# Patient Record
Sex: Female | Born: 1980 | Race: Black or African American | Hispanic: No | Marital: Single | Smoking: Smoker, current status unknown
Health system: Southern US, Community
[De-identification: ages and names within clinical notes are randomized; demographics above are authoritative.]

## PROBLEM LIST (undated history)

## (undated) DIAGNOSIS — R011 Cardiac murmur, unspecified: Secondary | ICD-10-CM

## (undated) DIAGNOSIS — Z9889 Other specified postprocedural states: Secondary | ICD-10-CM

## (undated) DIAGNOSIS — R51 Headache: Secondary | ICD-10-CM

## (undated) DIAGNOSIS — G8929 Other chronic pain: Secondary | ICD-10-CM

## (undated) DIAGNOSIS — R0789 Other chest pain: Secondary | ICD-10-CM

## (undated) DIAGNOSIS — R569 Unspecified convulsions: Secondary | ICD-10-CM

## (undated) DIAGNOSIS — J449 Chronic obstructive pulmonary disease, unspecified: Secondary | ICD-10-CM

## (undated) DIAGNOSIS — M7989 Other specified soft tissue disorders: Secondary | ICD-10-CM

## (undated) DIAGNOSIS — R0602 Shortness of breath: Secondary | ICD-10-CM

## (undated) DIAGNOSIS — K219 Gastro-esophageal reflux disease without esophagitis: Secondary | ICD-10-CM

## (undated) DIAGNOSIS — M79605 Pain in left leg: Secondary | ICD-10-CM

## (undated) DIAGNOSIS — M79604 Pain in right leg: Secondary | ICD-10-CM

## (undated) DIAGNOSIS — I38 Endocarditis, valve unspecified: Secondary | ICD-10-CM

## (undated) DIAGNOSIS — I10 Essential (primary) hypertension: Secondary | ICD-10-CM

## (undated) DIAGNOSIS — R112 Nausea with vomiting, unspecified: Secondary | ICD-10-CM

## (undated) DIAGNOSIS — F329 Major depressive disorder, single episode, unspecified: Secondary | ICD-10-CM

## (undated) DIAGNOSIS — I351 Nonrheumatic aortic (valve) insufficiency: Secondary | ICD-10-CM

## (undated) DIAGNOSIS — G2581 Restless legs syndrome: Secondary | ICD-10-CM

## (undated) DIAGNOSIS — F32A Depression, unspecified: Secondary | ICD-10-CM

## (undated) DIAGNOSIS — Z87898 Personal history of other specified conditions: Secondary | ICD-10-CM

## (undated) HISTORY — PX: CYSTOSCOPY/RETROGRADE/URETEROSCOPY/STONE EXTRACTION WITH BASKET: SHX5317

## (undated) HISTORY — DX: Nonrheumatic aortic (valve) insufficiency: I35.1

## (undated) HISTORY — DX: Other specified soft tissue disorders: M79.89

## (undated) HISTORY — DX: Pain in left leg: M79.605

## (undated) HISTORY — DX: Other chronic pain: G89.29

## (undated) HISTORY — PX: OTHER SURGICAL HISTORY: SHX169

## (undated) HISTORY — DX: Other chest pain: R07.89

## (undated) HISTORY — PX: TUBAL LIGATION: SHX77

## (undated) HISTORY — DX: Personal history of other specified conditions: Z87.898

## (undated) HISTORY — DX: Pain in right leg: M79.604

---
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
## 1999-04-29 ENCOUNTER — Ambulatory Visit (HOSPITAL_COMMUNITY): Admission: RE | Admit: 1999-04-29 | Discharge: 1999-04-29 | Payer: Self-pay | Admitting: *Deleted

## 1999-06-23 ENCOUNTER — Other Ambulatory Visit: Admission: RE | Admit: 1999-06-23 | Discharge: 1999-06-23 | Payer: Self-pay | Admitting: *Deleted

## 1999-08-13 ENCOUNTER — Inpatient Hospital Stay (HOSPITAL_COMMUNITY): Admission: RE | Admit: 1999-08-13 | Discharge: 1999-08-13 | Payer: Self-pay | Admitting: Obstetrics & Gynecology

## 1999-08-18 ENCOUNTER — Encounter (HOSPITAL_COMMUNITY): Admission: RE | Admit: 1999-08-18 | Discharge: 1999-09-10 | Payer: Self-pay | Admitting: Obstetrics

## 1999-09-07 ENCOUNTER — Inpatient Hospital Stay (HOSPITAL_COMMUNITY): Admission: AD | Admit: 1999-09-07 | Discharge: 1999-09-07 | Payer: Self-pay | Admitting: Obstetrics

## 1999-09-09 ENCOUNTER — Encounter (INDEPENDENT_AMBULATORY_CARE_PROVIDER_SITE_OTHER): Payer: Self-pay

## 1999-09-09 ENCOUNTER — Inpatient Hospital Stay (HOSPITAL_COMMUNITY): Admission: AD | Admit: 1999-09-09 | Discharge: 1999-09-12 | Payer: Self-pay | Admitting: *Deleted

## 1999-12-26 ENCOUNTER — Other Ambulatory Visit: Admission: RE | Admit: 1999-12-26 | Discharge: 1999-12-26 | Payer: Self-pay | Admitting: Obstetrics

## 1999-12-26 ENCOUNTER — Encounter (INDEPENDENT_AMBULATORY_CARE_PROVIDER_SITE_OTHER): Payer: Self-pay

## 2000-10-29 ENCOUNTER — Emergency Department (HOSPITAL_COMMUNITY): Admission: EM | Admit: 2000-10-29 | Discharge: 2000-10-29 | Payer: Self-pay | Admitting: *Deleted

## 2001-03-14 ENCOUNTER — Encounter (INDEPENDENT_AMBULATORY_CARE_PROVIDER_SITE_OTHER): Payer: Self-pay | Admitting: Specialist

## 2001-03-14 ENCOUNTER — Ambulatory Visit (HOSPITAL_BASED_OUTPATIENT_CLINIC_OR_DEPARTMENT_OTHER): Admission: RE | Admit: 2001-03-14 | Discharge: 2001-03-15 | Payer: Self-pay | Admitting: Orthopedic Surgery

## 2001-04-04 ENCOUNTER — Encounter: Payer: Self-pay | Admitting: Thoracic Surgery

## 2001-04-04 ENCOUNTER — Inpatient Hospital Stay (HOSPITAL_COMMUNITY): Admission: RE | Admit: 2001-04-04 | Discharge: 2001-04-06 | Payer: Self-pay | Admitting: Thoracic Surgery

## 2001-04-04 ENCOUNTER — Encounter (INDEPENDENT_AMBULATORY_CARE_PROVIDER_SITE_OTHER): Payer: Self-pay | Admitting: Specialist

## 2001-04-05 ENCOUNTER — Encounter: Payer: Self-pay | Admitting: Thoracic Surgery

## 2001-04-06 ENCOUNTER — Encounter: Payer: Self-pay | Admitting: Thoracic Surgery

## 2002-08-03 ENCOUNTER — Encounter (INDEPENDENT_AMBULATORY_CARE_PROVIDER_SITE_OTHER): Payer: Self-pay | Admitting: Specialist

## 2002-08-03 ENCOUNTER — Inpatient Hospital Stay (HOSPITAL_COMMUNITY): Admission: EM | Admit: 2002-08-03 | Discharge: 2002-08-04 | Payer: Self-pay | Admitting: Emergency Medicine

## 2002-08-03 ENCOUNTER — Encounter: Payer: Self-pay | Admitting: Emergency Medicine

## 2003-03-09 ENCOUNTER — Emergency Department (HOSPITAL_COMMUNITY): Admission: EM | Admit: 2003-03-09 | Discharge: 2003-03-09 | Payer: Self-pay | Admitting: Emergency Medicine

## 2003-05-24 ENCOUNTER — Ambulatory Visit (HOSPITAL_COMMUNITY): Admission: RE | Admit: 2003-05-24 | Discharge: 2003-05-24 | Payer: Self-pay | Admitting: *Deleted

## 2003-06-04 ENCOUNTER — Other Ambulatory Visit: Admission: RE | Admit: 2003-06-04 | Discharge: 2003-06-04 | Payer: Self-pay | Admitting: Obstetrics and Gynecology

## 2003-07-30 ENCOUNTER — Encounter: Payer: Self-pay | Admitting: Obstetrics and Gynecology

## 2003-07-30 ENCOUNTER — Inpatient Hospital Stay (HOSPITAL_COMMUNITY): Admission: AD | Admit: 2003-07-30 | Discharge: 2003-07-31 | Payer: Self-pay | Admitting: *Deleted

## 2003-07-30 ENCOUNTER — Encounter (INDEPENDENT_AMBULATORY_CARE_PROVIDER_SITE_OTHER): Payer: Self-pay | Admitting: Specialist

## 2003-11-06 ENCOUNTER — Encounter: Admission: RE | Admit: 2003-11-06 | Discharge: 2003-11-06 | Payer: Self-pay | Admitting: *Deleted

## 2003-11-20 ENCOUNTER — Encounter: Admission: RE | Admit: 2003-11-20 | Discharge: 2003-11-20 | Payer: Self-pay | Admitting: General Practice

## 2005-04-24 ENCOUNTER — Emergency Department (HOSPITAL_COMMUNITY): Admission: EM | Admit: 2005-04-24 | Discharge: 2005-04-24 | Payer: Self-pay | Admitting: Emergency Medicine

## 2006-11-09 HISTORY — PX: TUBAL LIGATION: SHX77

## 2007-02-08 ENCOUNTER — Emergency Department (HOSPITAL_COMMUNITY): Admission: EM | Admit: 2007-02-08 | Discharge: 2007-02-08 | Payer: Self-pay | Admitting: Emergency Medicine

## 2007-03-02 ENCOUNTER — Ambulatory Visit (HOSPITAL_COMMUNITY): Admission: RE | Admit: 2007-03-02 | Discharge: 2007-03-02 | Payer: Self-pay | Admitting: Obstetrics & Gynecology

## 2007-03-24 ENCOUNTER — Ambulatory Visit: Payer: Self-pay | Admitting: Obstetrics & Gynecology

## 2007-04-08 ENCOUNTER — Ambulatory Visit (HOSPITAL_COMMUNITY): Admission: RE | Admit: 2007-04-08 | Discharge: 2007-04-08 | Payer: Self-pay | Admitting: Obstetrics & Gynecology

## 2007-04-21 ENCOUNTER — Ambulatory Visit: Payer: Self-pay | Admitting: Obstetrics & Gynecology

## 2007-05-16 ENCOUNTER — Ambulatory Visit (HOSPITAL_COMMUNITY): Admission: RE | Admit: 2007-05-16 | Discharge: 2007-05-16 | Payer: Self-pay | Admitting: Obstetrics & Gynecology

## 2007-05-19 ENCOUNTER — Ambulatory Visit: Payer: Self-pay | Admitting: Family Medicine

## 2007-06-09 ENCOUNTER — Ambulatory Visit: Payer: Self-pay | Admitting: Family Medicine

## 2007-06-23 ENCOUNTER — Ambulatory Visit: Payer: Self-pay | Admitting: Obstetrics & Gynecology

## 2007-06-23 ENCOUNTER — Ambulatory Visit (HOSPITAL_COMMUNITY): Admission: RE | Admit: 2007-06-23 | Discharge: 2007-06-23 | Payer: Self-pay | Admitting: Family Medicine

## 2007-07-07 ENCOUNTER — Ambulatory Visit: Payer: Self-pay | Admitting: Family Medicine

## 2007-07-14 ENCOUNTER — Ambulatory Visit (HOSPITAL_COMMUNITY): Admission: RE | Admit: 2007-07-14 | Discharge: 2007-07-14 | Payer: Self-pay | Admitting: Family Medicine

## 2007-07-19 ENCOUNTER — Ambulatory Visit: Payer: Self-pay | Admitting: Family Medicine

## 2007-07-19 ENCOUNTER — Inpatient Hospital Stay (HOSPITAL_COMMUNITY): Admission: AD | Admit: 2007-07-19 | Discharge: 2007-07-25 | Payer: Self-pay | Admitting: Family Medicine

## 2007-07-20 ENCOUNTER — Encounter: Payer: Self-pay | Admitting: *Deleted

## 2007-07-21 ENCOUNTER — Encounter: Payer: Self-pay | Admitting: *Deleted

## 2007-07-22 ENCOUNTER — Encounter: Payer: Self-pay | Admitting: *Deleted

## 2007-07-22 ENCOUNTER — Encounter: Payer: Self-pay | Admitting: Family Medicine

## 2007-08-01 ENCOUNTER — Ambulatory Visit: Payer: Self-pay | Admitting: Obstetrics & Gynecology

## 2007-08-01 ENCOUNTER — Inpatient Hospital Stay (HOSPITAL_COMMUNITY): Admission: AD | Admit: 2007-08-01 | Discharge: 2007-08-03 | Payer: Self-pay | Admitting: Obstetrics & Gynecology

## 2007-08-05 ENCOUNTER — Ambulatory Visit: Payer: Self-pay | Admitting: *Deleted

## 2007-08-08 ENCOUNTER — Ambulatory Visit: Payer: Self-pay | Admitting: Obstetrics & Gynecology

## 2007-08-18 ENCOUNTER — Ambulatory Visit: Payer: Self-pay | Admitting: Obstetrics & Gynecology

## 2007-09-01 ENCOUNTER — Ambulatory Visit: Payer: Self-pay | Admitting: Gynecology

## 2007-11-16 ENCOUNTER — Ambulatory Visit (HOSPITAL_BASED_OUTPATIENT_CLINIC_OR_DEPARTMENT_OTHER): Admission: RE | Admit: 2007-11-16 | Discharge: 2007-11-16 | Payer: Self-pay | Admitting: Orthopedic Surgery

## 2007-11-16 ENCOUNTER — Encounter (INDEPENDENT_AMBULATORY_CARE_PROVIDER_SITE_OTHER): Payer: Self-pay | Admitting: Orthopedic Surgery

## 2008-04-18 ENCOUNTER — Emergency Department (HOSPITAL_COMMUNITY): Admission: EM | Admit: 2008-04-18 | Discharge: 2008-04-18 | Payer: Self-pay | Admitting: Emergency Medicine

## 2009-06-30 IMAGING — US US OB FOLLOW-UP
1 series · 14 of 28 positions shown · non-contrast
Comparison: none

OBSTETRICAL ULTRASOUND:
 This ultrasound was performed in The [HOSPITAL], and the [facility] report will be stored to [REDACTED] PACS.

[Series 1: us ob follow-up · 14 of 41 slices shown]
[im 2/41]
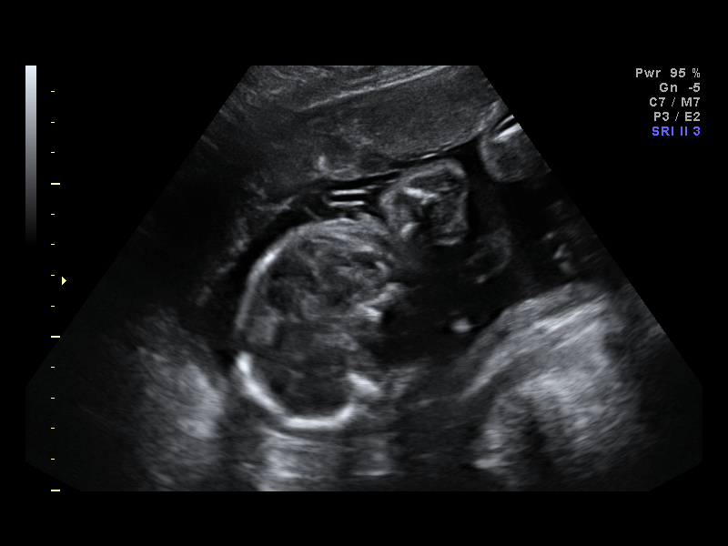
[im 5/41]
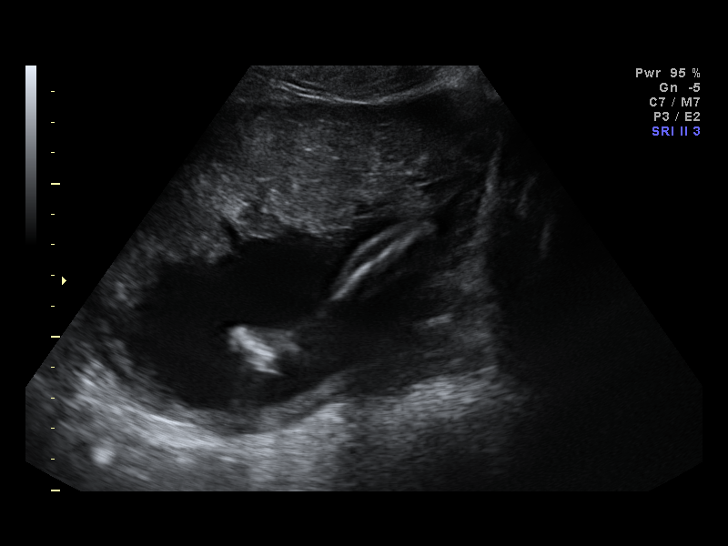
[im 8/41]
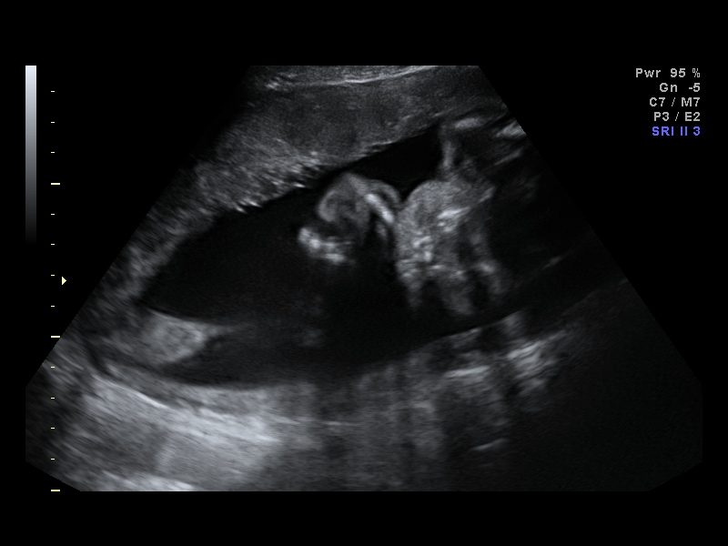
[im 11/41]
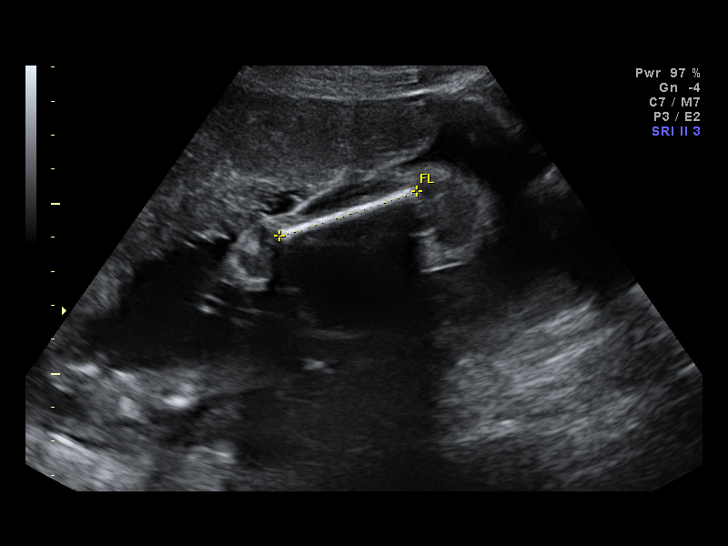
[im 14/41]
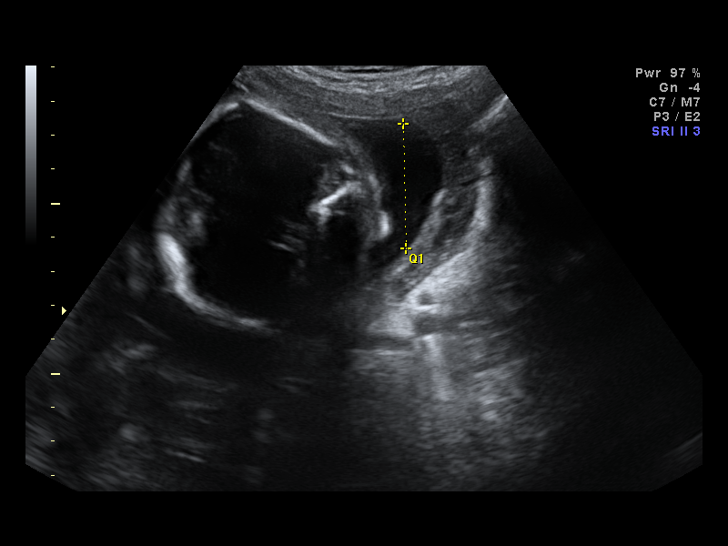
[im 17/41]
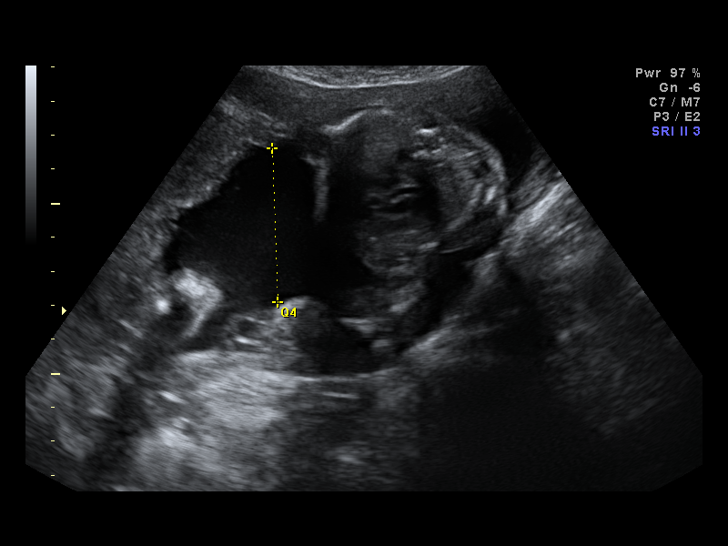
[im 20/41]
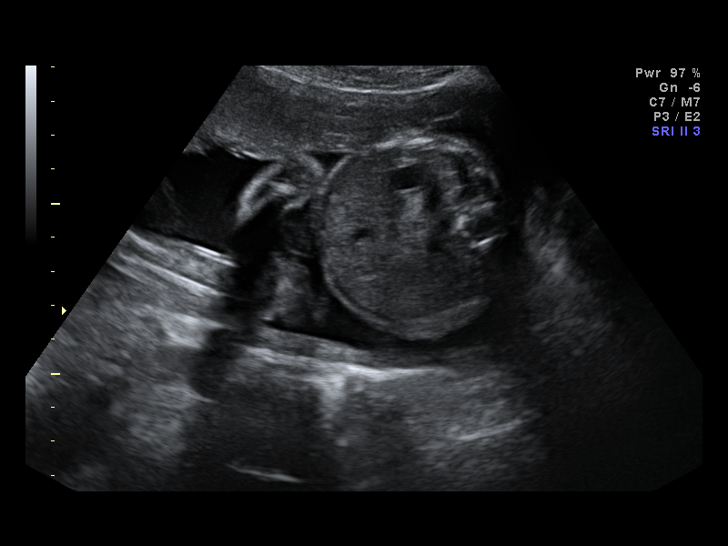
[im 23/41]
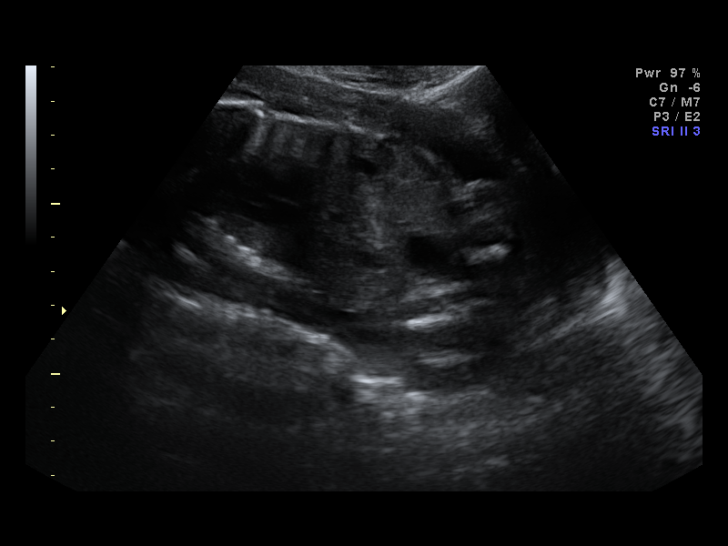
[im 26/41]
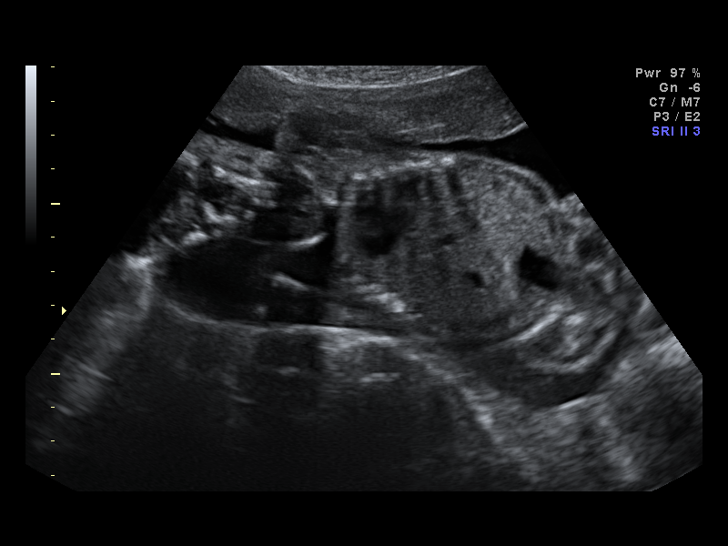
[im 29/41]
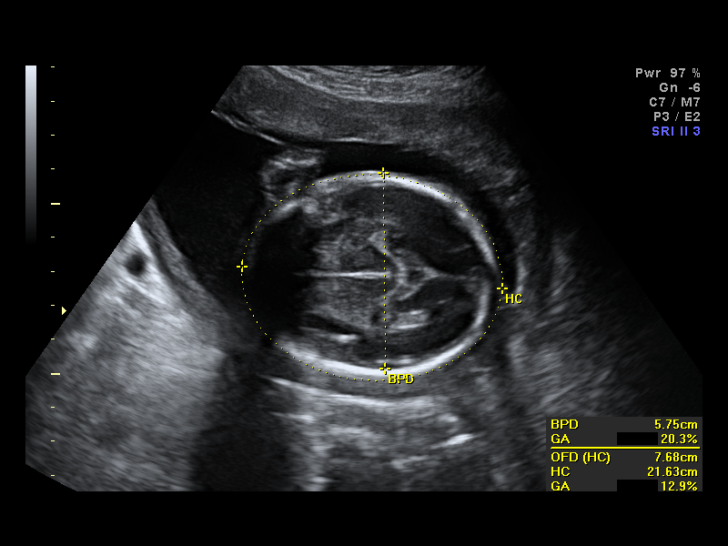
[im 32/41]
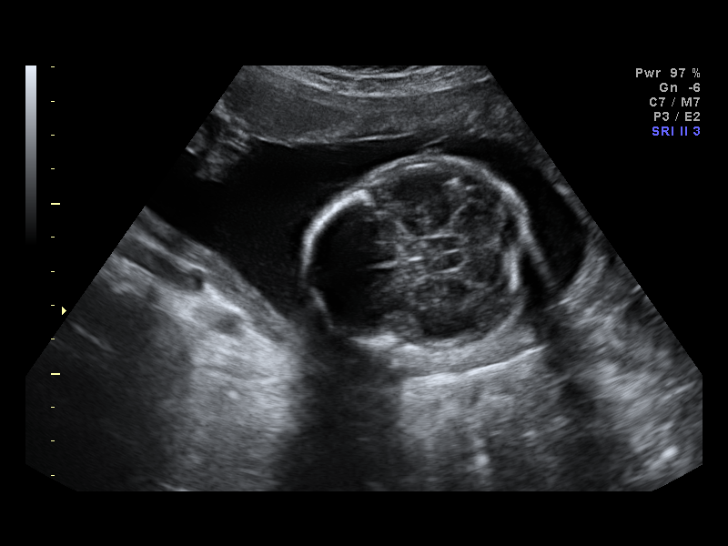
[im 35/41]
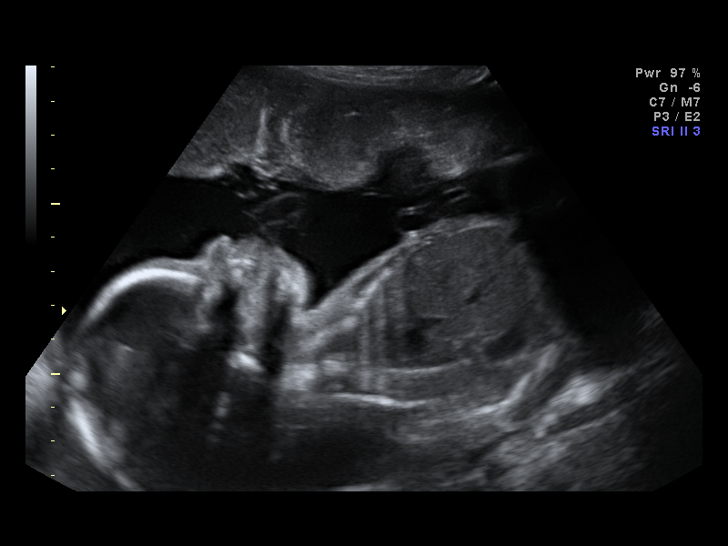
[im 38/41]
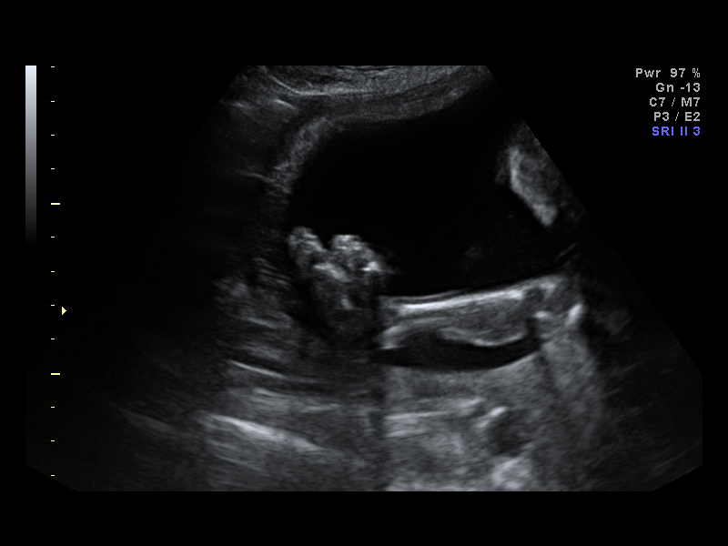
[im 41/41]
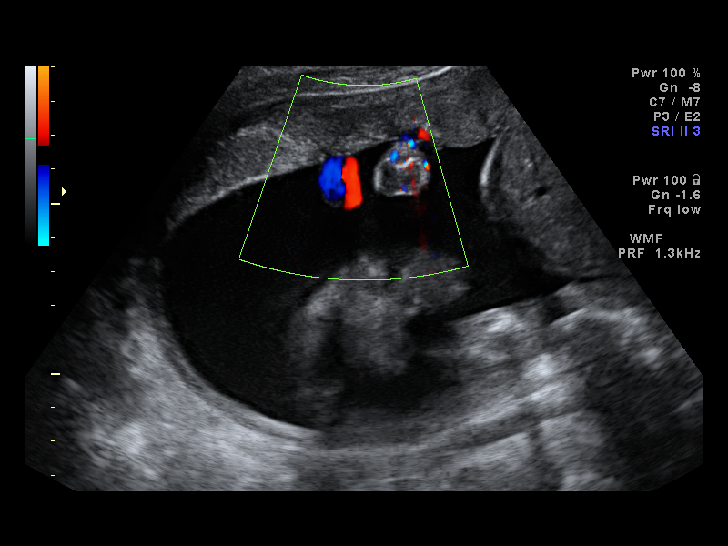

[14 of 28 positions shown; findings below may reference images not displayed]

IMPRESSION: The [facility] report has also been faxed to the ordering physician.

## 2009-10-22 ENCOUNTER — Emergency Department (HOSPITAL_COMMUNITY): Admission: EM | Admit: 2009-10-22 | Discharge: 2009-10-22 | Payer: Self-pay | Admitting: Emergency Medicine

## 2009-10-24 ENCOUNTER — Encounter: Payer: Self-pay | Admitting: Emergency Medicine

## 2009-10-24 ENCOUNTER — Inpatient Hospital Stay (HOSPITAL_COMMUNITY): Admission: AD | Admit: 2009-10-24 | Discharge: 2009-10-24 | Payer: Self-pay | Admitting: Obstetrics and Gynecology

## 2009-11-09 DIAGNOSIS — I351 Nonrheumatic aortic (valve) insufficiency: Secondary | ICD-10-CM

## 2009-11-09 HISTORY — DX: Nonrheumatic aortic (valve) insufficiency: I35.1

## 2010-12-01 ENCOUNTER — Encounter: Payer: Self-pay | Admitting: *Deleted

## 2011-02-09 LAB — URINALYSIS, ROUTINE W REFLEX MICROSCOPIC
Bilirubin Urine: NEGATIVE
Bilirubin Urine: NEGATIVE
Glucose, UA: NEGATIVE mg/dL
Glucose, UA: NEGATIVE mg/dL
Ketones, ur: NEGATIVE mg/dL
Ketones, ur: NEGATIVE mg/dL
Leukocytes, UA: NEGATIVE
Nitrite: NEGATIVE
Nitrite: NEGATIVE
Protein, ur: NEGATIVE mg/dL
Protein, ur: NEGATIVE mg/dL
Specific Gravity, Urine: 1.01 (ref 1.005–1.030)
Specific Gravity, Urine: <1.005 — ABNORMAL LOW (ref 1.005–1.030)
Urobilinogen, UA: 0.2 mg/dL (ref 0.0–1.0)
Urobilinogen, UA: 0.2 mg/dL (ref 0.0–1.0)
pH: 6 (ref 5.0–8.0)
pH: 6.5 (ref 5.0–8.0)

## 2011-02-09 LAB — URINE MICROSCOPIC-ADD ON

## 2011-02-09 LAB — CBC
HCT: 42.3 % (ref 36.0–46.0)
Hemoglobin: 14.1 g/dL (ref 12.0–15.0)
MCHC: 33.3 g/dL (ref 30.0–36.0)
MCV: 107.3 fL — ABNORMAL HIGH (ref 78.0–100.0)
Platelets: 236 10*3/uL (ref 150–400)
RBC: 3.95 MIL/uL (ref 3.87–5.11)
RDW: 14.1 % (ref 11.5–15.5)
WBC: 14.1 10*3/uL — ABNORMAL HIGH (ref 4.0–10.5)

## 2011-02-09 LAB — DIFFERENTIAL
Lymphocytes Relative: 10 % — ABNORMAL LOW (ref 12–46)
Lymphs Abs: 1.4 10*3/uL (ref 0.7–4.0)
Monocytes Absolute: 0.8 10*3/uL (ref 0.1–1.0)
Monocytes Relative: 6 % (ref 3–12)
Neutro Abs: 11.6 10*3/uL — ABNORMAL HIGH (ref 1.7–7.7)
Neutrophils Relative %: 82 % — ABNORMAL HIGH (ref 43–77)

## 2011-02-09 LAB — WET PREP, GENITAL: Clue Cells Wet Prep HPF POC: NONE SEEN

## 2011-02-09 LAB — GC/CHLAMYDIA PROBE AMP, GENITAL
Chlamydia, DNA Probe: NEGATIVE
GC Probe Amp, Genital: NEGATIVE

## 2011-02-09 LAB — WOUND CULTURE

## 2011-03-24 NOTE — Discharge Summary (Signed)
NAMEZOHA, SPRANGER NO.:  [id number]   MEDICAL RECORD NO.:  [id number]          PATIENT TYPE:  INP   LOCATION:                                FACILITY:  [facility]   PHYSICIAN:  Tanya S. Shawnie Pons, M.D.   DATE OF BIRTH:  01-24-1981   DATE OF ADMISSION:  DATE OF DISCHARGE:                               DISCHARGE SUMMARY   REASON FOR ADMISSION:  This is a pregnant woman who was admitted at 35  and 1/7 weeks' gestation with blood pressures of 155/83 and proteinuria.  She was also diagnosed with Trichomonas at admission as well as possible  UTI.  She was treated with Flagyl and Rocephin and was admitted and put  on magnesium sulfate.  She complained of headache with visual changes on  admission.  She was given repeated PIH labs and was followed.  Ultimately, had a classical C-section for preeclampsia and nonreassuring  fetal heart tones on September 12.  Magnesium was continued for 24 hours  postoperative and platelets during this stay were at 126, AST 47, ALT  40, and she had a 24-hour urine of 793.  She was discharged on September  15.   DISCHARGE MEDICATIONS:  Colace, Motrin, Vicodin and iron.  Prescriptions  were given for all.   DISCHARGE DIAGNOSES:  1. Preeclampsia.  2. Preterm birth by classical C-section.  3. Trichomonas.   The baby remains in the neonatal intensive care unit.  Discharge  hematocrit is 31.0, hemoglobin of 10.7.  The patient was instructed to  do no heavy lifting for 6 weeks and to remain on pelvic rest for 6 weeks  and to follow up at [facility] in 6 weeks for  continued care.      Romero Belling, MD      Shelbie Proctor. Shawnie Pons, M.D.  Electronically Signed    MO/MEDQ  D:  07/25/2007  T:  07/25/2007  Job:  [id number]

## 2011-03-24 NOTE — Op Note (Signed)
Kaitlin Wong, Kaitlin Wong              ACCOUNT NO.:  [id number]   MEDICAL RECORD NO.:  [id number]          PATIENT TYPE:  INP   LOCATION:  9371                          [facility]ACILITY:  [facility]   PHYSICIAN:  Allie Bossier, MD        DATE OF BIRTH:  09-Dec-1980   DATE OF PROCEDURE:  07/22/2007  DATE OF DISCHARGE:                               OPERATIVE REPORT   PREOPERATIVE DIAGNOSIS:  1. HELLP syndrome at 22 1/2 weeks' estimated gestational age.  2. Multiparity, desires sterility.   POSTOPERATIVE DIAGNOSIS:  1. HELLP syndrome at 72 1/2 weeks' estimated gestational age.  2. Multiparity, desires sterility.   PROCEDURE:  Classical primary cesarean section and occlusion of right  oviduct with a Filshie clip, sterilization procedure.   SURGEON:  Allie Bossier, M.D.   ANESTHESIOLOGIST:  Angelica Pou, M.D.   ANESTHESIA:  Spinal.   COMPLICATIONS:  None.   ESTIMATED BLOOD LOSS:  Less than 600 mL.   SPECIMENS:  Cord blood, placenta, and cord pH (7.29).   DETAILS OF PROCEDURE AND FINDINGS:  Preoperatively, the risks, benefits,  and alternatives of surgery were explained to her and accepted.  She,  again, reiterated her progress for sterility.  She understands the 1%  failure rate as well as the permanence of the procedure.  She declines  alternative forms of birth control.  Consents were signed. In the  operating room, spinal anesthesia was done without complication.  She  was placed in the dorsal supine position with a left lateral tilt.  Her  abdomen was prepped and draped in the usual sterile fashion.  A Foley  catheter was placed which drained clear urine throughout the case. After  adequate anesthesia was assured, 3 mL of 0.25% Marcaine was injected in  the subcutaneous tissue approximately 2 cm above the symphysis pubis, at  the site of her previous scar.  An incision was made at the site of her  previous scar.  The incision was carried down through the scant amount  of subcutaneous tissue  to the fascia.  The fascia was scored in the  midline. The fascial incision was extended bilaterally.  The rectus  muscles were partially separated in a transverse fashion using  electrosurgical technique, maintaining excellent hemostasis.  The  inferior epigastric vessels were left intact.  The peritoneum was  entered with hemostats.  The peritoneal incision was extended  bilaterally with the Bovie, taking care to avoid the bladder blade.   The the bladder blade was placed.  A vertical incision was made on the  lower uterine segment of the very small uterus.  The uterine incision  was extended vertically with bandage scissors.  The placenta was in an  anterior position.  I went through the placenta with a hemostat to reach  the amnion.  The amniotic fluid was noted to be clear.  The very small  growth retarded baby was removed from a vertex presentation.  His mouth  and nostrils were suctioned, his cord was clamped and cut, and he was  transferred to the NICU personnel for further care.  Weight is yet to be  determined.  Apgars were 4, 7, and 7.  Cord pH was 7.29.  The placenta  was extracted manually and noted to be tenuous ratty.  It was sent to  pathology for further evaluation.  The uterus was exteriorized and  cleaned with a dry lap sponge.  The uterine incision was closed with two  segments of 0 chromic running locking suture, the second layer  imbricated the first to obtain excellent hemostasis.  Two figure-of-  eight sutures were required at the mid position of the uterus.   The ovaries were inspected and noted to be normal.  The right oviduct  appeared normal and a Filshie clip was placed in the isthmic region  across to the tube.  The left oviduct was absent.  The uterus was placed  back in the abdominal cavity.  The rectus muscles were inspected.  The  pyramidalis muscle required treatment with the Bovie to yield excellent  hemostasis.  The remainder of the muscles were  hemostatic.  The uterine  incision was again inspected and noted to be hemostatic.  The rectus  fascia was closed with a #1 Prolene suture.  The ends of the suture were  buried to prevent future patient discomfort.  The subcu tissue was  irrigated, cleaned and dried. A subcuticular closure was then done with  3-0 Vicryl running nonlocking suture.  Steri-Strips were placed across  the intact incision.  The Foley catheter drained clear urine throughout.  She was taken to the recovery room in stable condition.  Please note,  she was given a gram of Ancef after the cord was clamped.      Allie Bossier, MD  Electronically Signed     MCD/MEDQ  D:  07/22/2007  T:  07/23/2007  Job:  [id number]

## 2011-03-24 NOTE — Discharge Summary (Signed)
Kaitlin Wong, MATTERA NO.:  [id number]   MEDICAL RECORD NO.:  [id number]          PATIENT TYPE:  INP   LOCATION:  9320                          [facility]ILITY:  [facility]   PHYSICIAN:  Allie Bossier, MD        DATE OF BIRTH:  02-07-1981   DATE OF ADMISSION:  08/01/2007  DATE OF DISCHARGE:  08/03/2007                               DISCHARGE SUMMARY   ADMISSION DIAGNOSES:  1. Fascial dehiscence of cesarean section incision.  2. Status post classical primary cesarean section with Pfannenstiel      skin incision, postoperative day number 10.  3. Leukocytosis.   DISCHARGE DIAGNOSES:  1. Status post repair of fascial defect, postoperative day number two.  2. Leukocytosis, improved.  3. Status post classical cesarean section with Pfannenstiel skin      incision, postoperative day number 1   OPERATION PERFORMED:  The patient had repair of a fascial defect due to  fascial herniation with evisceration performed by Dr. Nicholaus Bloom on Aug 01, 2007.   COMPLICATIONS:  None.   CONSULTATIONS:  None.   LABORATORY VALUES:  Pertinent laboratory values the patient had on  admission:  White blood cell count of 16.8, hemoglobin 11.9 and  platelets 253,000.  She had a comprehensive metabolic panel that was  within normal limits including liver function tests.  She had a follow  up white blood cell count on hospital day number two of 23.3, hemoglobin  11.6 and platelet count 236,000.  This was repeated on hospital day  number three with a white blood cell count of 13.7, hemoglobin 11.1 and  platelets 195,000.  She had wound cultures, aerobic and anaerobic,  drawn.  The aerobic culture grew out multiple organisms, none  predominant.  Her anaerobic culture had no growth at the time of  discharge.   ADMISSION HISTORY:  This is a 30 year old female who presents with  increased drainage from her C section site 10 days status post primary  classical C section with bilateral tubal ligation for  HELLP syndrome at  28 weeks.  The patient states the drainage has been present for the past  couple days and she has noted some increasing pain as well.  She was  evaluated and found to have a 1 cm area of dehiscence with the question  of protruding bowel in the low-transverse skin incision.  The patient is  admitted for surgical repair of this dehiscence.   HOSPITAL COURSE:  The patient was admitted and taken to the operating  room immediately where she had repair of the fascial defect on August 01, 2007 performed by Dr. Nicholaus Bloom.  The patient tolerated the  procedure well and was stable postoperatively.  She was followed with  serial CBCs on hospital days two and three, which showed initially an  elevation in the white blood cell count, but it dropped to a level of  13.7 at the time of discharge.  The patient was passing flatus and  tolerating a regular diet well without any vomiting or nausea.  She was  appropriatelly tender in her  abdomen diffusely without  rebound,  tenderness or guarding.  She had positive bowel sounds in all four  quadrants.  Her vital signs were stable with her last fever noted to be  on August 02, 2007 at 6 A.M. of 100.9.  She had been afebrile for  greater than 24 hours at the time of discharge.  Pulse was stable.  Respirations were 20 or less.  She did have a couple elevated systolic  blood pressures in the 150s.   The patient was ready for discharge and clinically stable.   DISCHARGE STATUS:  Stable.   DISCHARGE MEDICATIONS:  1. Percocet 5/325 one to two tablets 4-6 as needed for pain.  2. Motrin 600 mg 1 tablet every 8 hours as needed for pain.  3. The patient is to continue her home medications of Colace, ferrous      sulfate and prenatal vitamins.   DISCHARGE INSTRUCTIONS:  1. Discharged to home.  2. Regular diet.  3. No lifting greater than 10 pounds for the next six weeks.  4. No driving for the next two weeks.  5. No sexual activity for  the next six weeks.  6. The patient is to follow up at the [facility] on      Friday, August 06, 2007 at 10:30 in the morning for staple      removal and wound check.  The patient is to call to confirm the      appointment.      Kaitlin Lemon, MD  Electronically Signed     ______________________________  Allie Bossier, MD    NS/MEDQ  D:  08/03/2007  T:  08/04/2007  Job:  [id number]

## 2011-03-24 NOTE — Op Note (Signed)
Kaitlin Wong, ALL NO.:  [id number]   MEDICAL RECORD NO.:  [id number]          PATIENT TYPE:  WOC   LOCATION:  WOC                          FACILITY:  [facility]   PHYSICIAN:  Allie Bossier, MD        DATE OF BIRTH:  Apr 01, 1981   DATE OF PROCEDURE:  08/01/2007  DATE OF DISCHARGE:                               OPERATIVE REPORT   PREOPERATIVE DIAGNOSIS:  Fascial dehiscence with evisceration.   POSTOPERATIVE DIAGNOSIS:  Fascial dehiscence with evisceration.   PROCEDURE:  Repair of fascial defect.   SURGEON:  Clarisa Kindred, MD   ASSISTANTYetta Barre, MD   ANESTHESIA:  Helmut Muster, MD, spinal/general.   COMPLICATIONS:  None.   ESTIMATED BLOOD LOSS:  Minimal.   SPECIMENS:  None.   FINDINGS:  Small piece of bowel eviscerated from the skin incision.  The  bowel was pink and healthy and clearly not infarcted.  No other  abnormalities.   DETAILS OF PROCEDURE AND FINDINGS:  The risks, benefits, alternatives of  surgery were explained and accepted.  Consents were signed in the MAU.  In the operating room spinal anesthesia was applied.  Her vagina and  abdomen were prepped and draped usual sterile fashion.  A Foley catheter  was placed which drained clear urine throughout the case.  After  adequate anesthesia was assured, approximately 10 mL of 0.5% Marcaine  was injected into the subcutaneous tissue taking care to avoid the area  near the eviscerated bowel.  Using Allis clamps at the edge of the  incision, the incision was pulled open, the subcuticular stitch of 3-0  Vicryl was cut as needed and the bowel was freed up.  It appeared to be  pink and healthy.  There were no other evisceration areas.  The #1  Prolene from her previous fascial closure had intact knots at both ends  but the suture itself was broken toward the left edge of the incision.  The stitch was removed entirely.  The pelvis was irrigated with a liter  of warm normal saline.  I inspected the bowel and found  no  abnormal/infarcted areas.  I began the reclosure with a #1 PDS looped  suture.  However, there was bleeding in the rectus muscle on the right-  hand side so the suture was removed and the bleeding was contained with  a 0 Vicryl suture done in figure-of-eight fashion at the site of the  inferior epigastric vessels.  This yielded excellent hemostasis.  At  this point I reclosed the fascia using a #1 PDS loop suture.  I  incorporated approximately 1.5 cm of fascia at each side.  There were no  defects noted.  The ends of the knot were buried to prevent future  patient discomfort.  The skin was closed with staples.  She was  extubated and taken to recovery in  stable condition.  Please note that at the initiation of the case even  though she was comfortable and there was too much pressure on her bowel  and I was unable to thoroughly evaluate the bowel prior  to closure  without having her intubated and with general anesthesia.  She tolerated  procedure well.      Allie Bossier, MD  Electronically Signed     MCD/MEDQ  D:  08/01/2007  T:  08/02/2007  Job:  [id number]

## 2011-03-24 NOTE — Op Note (Signed)
NAME:  Kaitlin Wong, Kaitlin Wong              ACCOUNT NO.:  [id number]   MEDICAL RECORD NO.:  [id number]          PATIENT TYPE:  AMB   LOCATION:  DSC                          FACILITY:  [facility]   PHYSICIAN:  Matthew A. Weingold, M.D.DATE OF BIRTH:  30-Jun-1981   DATE OF PROCEDURE:  11/16/2007  DATE OF DISCHARGE:                               OPERATIVE REPORT   PREOPERATIVE DIAGNOSIS:  Painful left hand dorsal mass.   POSTOPERATIVE DIAGNOSIS:  Painful left hand dorsal mass.   PROCEDURE:  Incisional biopsy of above.   SURGEON:  Artist Pais. Mina Marble, M.D.   ASSISTANT:  None.   ANESTHESIA:  General.   TOURNIQUET TIME:  21 minutes.   No complications. No drains.  One specimen sent.   OPERATIVE REPORT:  The patient taken to operating suite.  After the  induction of adequate general anesthesia, left upper extremity was  prepped and draped in sterile fashion.  An Esmarch was used to  exsanguinate the limb.  Tourniquet was then inflated to 250 mm.  At this  point in time, incision was made longitudinally over the dorsal ulnar  aspect of the left hand over a pre-marked painful mass.  Skin was  incised.  Dissection was carried down where a large branch of the ulnar  sensory nerve was carefully retracted.  A large thrombosed dorsal vein  was identified, carefully tied off proximally and distally with 3-0  undyed Vicryl ties.  The thrombosed vein was then transected, and  visualization grossly under loupe magnification revealed significant  thrombosis with possible foreign material.  The wound was then  thoroughly irrigated, hemostasis was achieved with bipolar cautery, was  loosely closed with 3-0 Prolene subcuticular stitch.  Steri-Strips,  4x4s, fluffs, and compressive dressing was applied.  The patient  tolerated the procedure well and went to recovery in stable fashion.      Artist Pais Mina Marble, M.D.  Electronically Signed     MAW/MEDQ  D:  11/16/2007  T:  11/16/2007  Job:  [id number]

## 2011-03-27 NOTE — H&P (Signed)
[place]  Patient:    Kaitlin Wong, Kaitlin Wong                     MRN: [id number] Adm. Date:  [id number] Attending:  Cameron Proud Dictator:   Dominica Severin, P.A.                         History and Physical  DATE OF BIRTH:  1981/07/05  CHIEF COMPLAINT:  Mass on the right chest wall.  HISTORY OF PRESENT ILLNESS:  This is a 30 year old African-American female who was seen in the office by Dr. Algis Downs. Karle Plumber last week for the evaluation of a right chest wall mass.   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  The patient does not know what the condition is called, although she has had some of these masses removed from her knee and from her pelvic area.  The patient presented to Dr. Edwyna Shell for evaluation, to have the cyst or tumor removed from her right chest wall area.  PAST MEDICAL HISTORY:  The patient does not have any significant past medical history.  She denies any history of coronary artery disease, no pulmonary disease, gastrointestinal, genitourinary, or renal diseases.  PAST SURGICAL HISTORY:  As stated above, the patient had cysts removed from her legs and her bilateral pelvic areas on Mar 14, 2001.  The patient has fractured her wrist before.  SOCIAL HISTORY:  The patient states she smokes approximately 1/2 pack per day for six years.  Occasionally drinks alcohol.  Denies any IV drug use.  She is single.  She does not have any children.  CURRENT MEDICATIONS:  She currently takes Vioxx and aspirin.  ALLERGIES:  No known drug allergies.  REVIEW OF SYSTEMS:  The patient denies any hypertension, phlebitis, peripheral vascular disease, murmurs, heart failure, chest pain, history of a myocardial infarction, or arrhythmias.  PULMONARY:  The patient denies any asthma, COPD, recent upper respiratory infection, or shortness of breath, or dyspnea on exertion.  NEUROLOGIC:  The  patient denies any history of seizures or falling, brain tumors, or strokes.  Denies any extremity weakness or numbness. ENDOCRINE:  The patient denies any history of diabetes mellitus or thyroid problems.  GASTROINTESTINAL:  The patient denies any history of reflux, ulcers, bleeding from her stools, or bowel problems.  GENITOURINARY:  The patient denies any  history of renal insufficiency or failure.  Denies any hematuria, dysuria, urgency, or incontinence.  MUSCULOSKELETAL:  The patient states that her joints swell from poor circulation, although does not know the etiology of her problems with her swelling.  Denies any history of peripheral vascular disease.  EENT:  Within normal limits.  REPRODUCTIVE:  Last menstrual period was Mar 15, 2001.  The patient denies any history of problems with her skin or sleeping difficulties.  PHYSICAL EXAMINATION:  VITAL SIGNS:  Blood pressure 120/66, pulse 67, respirations 18, temperature 98.0 degrees.  Weight 102 pounds.  Height is 5 feet 1 inch.  HEENT:  Pupils equal, round, reactive to light and accommodation.  EOMI.  Nose is patent.  NECK:  Supple without bruits, thyromegaly, or JVD.  CHEST:  Clear to auscultation bilaterally with equal expansion.  HEART:  A regular rate and rhythm.  No murmurs, gallops, or rubs.  ABDOMEN:  Soft, nontender.  Positive bowel sounds, no distention.  EXTREMITIES:  The patient  is able to move all four extremities.  She does have some minimal edema in both extremities, nonpitting.  She has +2 dorsalis pedis pulses bilaterally.  She does have healed scars on her right lower extremity from a previous removal of her cyst, as well as a scar on her right pelvic area along the pubic hair line.  NEUROLOGIC:  The patient is alert and oriented x 3.  Cranial nerves II-XII are grossly intact.  IMPRESSION:  Mass in right fifth rib area.  PLAN:  Full resection/excision of a portion of the right fifth rib and the mass, to  be sent for biopsy and pathology. DD:  04/04/01 TD:  04/04/01 Job: [id number]

## 2011-03-27 NOTE — Op Note (Signed)
[place]  Patient:    Kaitlin Wong, Kaitlin Wong                     MRN: [id number] Proc. Date: 03/14/01 Adm. 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  SURGICAL PROCEDURE:  Removal of all four osteochondromas through three separate incisions.  SURGEON:  John L. Dorothyann Gibbs, M.D.  ANESTHESIA:  General.  PATHOLOGY:  The patient has one large cauliflower-type osteochondroma coming off the flare of the medial femoral condyle metaphysis.  Down lower to the collateral ligament, she has another osteochondroma, about a 1/2 inch in size. It is also quite painful.  It has a saphenous nerve branch passing over it. Down on the medial tibial metaphysis, there is another painful osteochondroma that has semitendinosus tendon passing over it.  On the right side of the symphysis pubis, there is a centimeter in diameter shaped osteochondroma coming off the pubic ______.  It is painful to local pressure.  DESCRIPTION OF PROCEDURE:  Under general anesthesia, the right leg was prepared with Betadine, and draped as a sterile field.  The leg is elevated and the tourniquet is used to 350 mm.  The symphysis area is likely prepared with Betadine, and lock draped with towels, and sterile drape with Ioban.  The knee was approached first and a 4 inch incision was made on straight medial side of the knee, overlying the flare of the medial femoral condyle. Dissection was carried down through skin and  subcutaneous tissue, elevating the vastus medialis from the bony tumor directly beneath it.  A cauliflower head erupted from underneath it after dissection was more complete.  Using a 1/2 inch curved osteotome and mallet, this was amputated from its base and great care was taken to trim the base the remove sharp bony edges and remnants of osteochondroma.  The more distal osteochondroma was then exposed, continuing the same incision distally, and it had no important tendinous structures over it, but there did appear to be saphenous nerve branch in the vicinity of it.  This was peeled away posteriorly and osteotome and mallet were used to remove this as well.  Bone wax was placed in the bleeding bone beds and both osteochondromas. The fascia was then closed over the vastus medialis with interrupted 2-0 Vicryl, the subcutaneous with 2-0 Vicryl, and the skin with clips.  Attention was then turned to the medial tibial metaphysis where a 1-1/2 inch incision was made.  The semitendinosus was elevated superiorly and what may have been a small nerve branch was reflected inferiorly.  The osteotome was used at the base of the osteochondroma from below to peel it from the bony surface, and it was removed in a retrograde manner.  Once this was out, a Beyer rongeur was used to smooth the bone bed and bone wax was applied.  The subcutaneous tissues were closed with 2-0 Vicryl and the skin with ciips.  Attention was then turned to  the region of the symphysis.  A 1-1/2 inch incision was made.  Dissection was carried through skin and subcutaneous tissue to superficial fascia.  This was opened horizontally by blunt dissection directly over the osteochondroma.  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  What appeared to be a pudendal nerve was retracted medially.  The osteotome was then removed from its base using a lateral osteotome.  Minor trimming of sharp bone edges was done with  Beyer rongeur and bone wax was applied.  Fascial closure was done with 2-0 Vicryl, the subcutaneous with 2-0 Vicryl, and the skin with clips.  Total operative time for all three was about 55 minutes.  The patient tolerated the procedure well and returned to recovery in good condition with sterile compression bandages, and the wounds infiltrated with Marcaine 0.5% with epinephrine. DD:  03/14/01 TD:  03/15/01 Job: [id number]

## 2011-03-27 NOTE — Op Note (Signed)
NAME:  Kaitlin Wong, Kaitlin Wong                        ACCOUNT NO.:  [id number]   MEDICAL RECORD NO.:  [id number]                   PATIENT TYPE:  INP   LOCATION:  0460                                 FACILITY:  [facility]   PHYSICIAN:  Leatha Gilding. Mezer, M.D.               DATE OF BIRTH:  January 21, 1981   DATE OF PROCEDURE:  08/03/2002  DATE OF DISCHARGE:                                 OPERATIVE REPORT   PREOPERATIVE DIAGNOSIS:  Ruptured ectopic pregnancy.   POSTOPERATIVE DIAGNOSIS:  Left ampullary ruptured ectopic pregnancy.   OPERATION PERFORMED:  Exploratory laparotomy and left salpingectomy.   SURGEON:  Leatha Gilding. Mezer, M.D.   ASSISTANT:  Beather Arbour. Thomasena Edis, M.D.   ANESTHESIA:  General endotracheal.   PREPARATION:  Betadine.   DESCRIPTION OF PROCEDURE:  With the patient in lithotomy position was  prepped and draped in the routine fashion.  Laparoscopy was attempted by  making a subumbilical incision.  The Veress needle was introduced with  return of significant amount of dark blood.  Laparoscopy was abandoned at  this point, and the skin incision was closed with interrupted 4-0 chromic  suture.  A Pfannenstiel incision was then made through the skin and  subcutaneous tissue and peritoneum opened with a large amount of dark blood  and clots.  The blood and clot was evacuated.  The right tube and ovary  found to be normal.  The uterus was normal, and the left fallopian tube had  a ruptured distal ampullary ectopic pregnancy that was adherent to the  ovarian surface.  There were some adhesions between the fallopian tube and  ovary which were taken down with electrocautery.  Given the fact that the  fimbria were destroyed and the tube was significantly damaged and the right  fallopian tube appeared to be normal, the decision was made to proceed with  salpingectomy.  The mesosalpinx was serially cross-clamped with small bites  to reduce the chance of the suture pulling through.  The mesosalpinx  was  sutured with 0 chromic sutures.  After serially cross-clamping and tying the  mesosalpinx, the isthmic portion of the tube was clamped, cut, and suture  ligated with 0 chromic suture.  The pelvis was then very carefully washed,  removing all of the clot possible.  The suture site inspected, and  hemostasis was completely intact.  The raw surface of the ovary was closed  with a running locked 3-0 Vicryl suture.  This was done because the base was  bleeding and was not easily arrested with cautery.  The abdomen was then  closed in layers using a running 2-0 Vicryl on the peritoneum, running 0  Vicryl midline bilaterally on the fascia.  Hemostasis was assured in the  subcutaneous tissue, and the skin was closed with staples.  The estimated  blood loss was 1000 cc in the abdomen at opening, 100 cc from the procedure.  The patient tolerated  the procedure well and was taken to recovery room in  satisfactory condition.                                              Leatha Gilding. Mezer, M.D.   HCM/MEDQ  D:  08/03/2002  T:  08/03/2002  Job:  [id number]   cc:   Beather Arbour. Thomasena Edis, M.D.

## 2011-03-27 NOTE — Discharge Summary (Signed)
[place]. Kaitlin Wong  Patient:    Kaitlin Wong, Kaitlin Wong                     MRN: [id number] Adm. Date:  [id number] Disc. Date: 04/06/01 Attending:  Cameron Wong Dictator:   Kaitlin Wong, RNFA CC:         [facility]   Discharge Summary  DATE OF BIRTH:  Mar 29, 1981  ADMISSION DIAGNOSIS:  Mass right fifth rib.  PAST MEDICAL HISTORY:  Status post cyst removal from knee and pelvis.  ALLERGIES:  No known drug allergies.  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  HISTORY OF PRESENT ILLNESS AND Wong COURSE:  [name] is a 30 year old African-American female who was evaluated by Dr. Edwyna Shell in his office last week for a right chest wall mass.  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  She has recently had some of these masses removed from her knee and from her pelvic area.  After review of the records and examination of the patient, Dr. Edwyna Shell recommended open excision of her fifth rib including the mass.  On Apr 04, 2001, [name] was electively admitted to [facility] by Dr. Algis Downs. Karle Plumber.  She underwent uneventful excision of a portion of her right fifth rib with mass. At the conclusion of the procedure she was transferred in stable condition to the PACU.  [name] postoperative course has been uneventful.  Her chest tube was discontinued the first postoperative day.  She did experience some nausea initially after surgery but, by the morning of Apr 06, 2001, her p.o. intake had improved, and she was ambulating adequately.  She was discharged home on the afternoon of Apr 06, 2001.  CONDITION ON DISCHARGE:  Improved.  DISCHARGE INSTRUCTIONS:  Include medications, diet, activity, wound care, and follow-up appointments.  Please see discharge instruction sheet for details.  DISCHARGE MEDICATIONS: 1. Tylox 1-2 p.o. q.4-6h. p.r.n. for  pain. 2. Zyrtec 10 mg p.o. q.d.  FOLLOW-UP:  [name] has an appointment to see Dr. Edwyna Shell at the [facility] office on April 13, 2001, at 2 p.m. DD:  04/06/01 TD:  04/06/01 Job: [id number]

## 2011-03-27 NOTE — Discharge Summary (Signed)
NAME:  Kaitlin Wong, Kaitlin Wong                        ACCOUNT NO.:  [id number]   MEDICAL RECORD NO.:  [id number]                   PATIENT TYPE:  EMS   LOCATION:  ED                                   FACILITY:  [facility]   PHYSICIAN:  Leatha Gilding. Mezer, M.D.               DATE OF BIRTH:  1981/09/24   DATE OF ADMISSION:  08/02/2002  DATE OF DISCHARGE:  08/03/2002                                 DISCHARGE SUMMARY   ADMISSION DIAGNOSIS:  Ruptured ectopic pregnancy.   HISTORY OF PRESENT ILLNESS:  The patient is a 30 year old gravida 4, para 1,  SAB 2, black female admitted with abdominal pain, positive pregnancy test,  and grossly abnormal ultrasound.  The patient's last pregnancy was in 11/00,  which resulted in a vaginal delivery at term.  The patient has been using  condoms fairly reliably for contraception.  Her last normal menstrual period  was on 06/18/02, and she has been having light spotting for the last three  days.  Significant midline cramping began at approximately 3 a.m. on  08/02/02.  The pain began to be progressively worse during the day, although  the patient was able to sleep.  She experienced some nausea and vomiting in  the afternoon, and with the second episode of nausea and vomiting the  patient stated that she arrived at the [facility] Emergency  Department when the sun was still up.  The best guess is that she arrived in  the emergency department between 7 and 7:30 p.m., and was first logged in by  triage at 2103.  At that time, she had a positive urine pregnancy test, her  white count was 23,000, hemoglobin was 10.5.  The patient was evaluated by  the emergency room physician, and a quantitative hCG of 2394 was obtained.  Ultrasound was ordered.  The scan was done at approximately 12:30 a.m., and  is there was not a radiologist on site at the [facility], the  images were sent to the radiologist at [facility].  The radiologist  was apparently  involved in a procedure, and the report was delayed to the ER  physician at [facility].  The report stated that there was a 2.5 x  2.4 x 2.4 cm collection of fluid in the endometrial cavity, question clot,  question abnormal pregnancy, and measured 7 weeks 6 days with a questionable  spontaneous abortion.  There was an 11.2 x 9.2 x 5.4 cm inhomogeneous mass  in the pelvis, and moderate free fluid was reported.  I was called by the  emergency room physician at 3 a.m., and the above details were reported to  me without mention of free fluid in the pelvis, and the probable diagnosis  was missed AB with possible tubo-ovarian abscess.  With that presumed  diagnosis, an attempt was made to transfer the patient to [facility].  After 45 minutes I was called by the nursing supervisor at  the [facility] and told that [facility]  because they had no beds available, and that the patient would need to be  seen at the [facility].  I arrived 12 minutes after that phone  call.  Other pertinent history, the patient did have gonorrhea as a  teenager.  She cannot recall the date, but it was long before her  intrauterine pregnancy.   PAST SURGICAL HISTORY:  1. Breast cyst.  2. Groin cyst.  3. Leg cyst.   PAST MEDICAL HISTORY:  Noncontributory.   MEDICATIONS:  None.   ALLERGIES:  No known drug allergies.   HABITS:  Smokes 1/2 pack of cigarettes a day.  ETOH occasional.   SOCIAL HISTORY:  The patient works at night at [employer] and cares  for her young child.  She apparently has a supportive mother who was here  with her until the middle of the night, and I have conversed with the mother  on the telephone.   FAMILY HISTORY:  Noncontributory.   PHYSICAL EXAMINATION:  HEENT:  Negative.  LUNGS:  Clear.  HEART:  Without murmurs.  ABDOMEN:  Soft, diffusely tender, mostly in the lower quadrants.  Left is  somewhat  greater than right without rebound.  PELVIC:  Deferred.  EXTREMITIES:  Negative.   I personally took the patient for a repeat ultrasound scan with the same  technician that had performed the first scan.  At that time, the collection  of fluid in the uterus had moved significantly lower, and it was very  irregular with no sign of a sac, and appeared to be fluid and clot.  There  was a large amount of free fluid in the peritoneal cavity, and the  technician stated that there was fluid around the liver, as had been  previously seen on the first scan.  There was a very large mass in the  pelvis, and a differentiating abscess from blood and clot was quite  difficult.  There were no thick walls to suggest abscess, and there was a  bit of a swirling pattern to suggest blood clot.  There was an area in the  adnexa that was suspicious, but far from diagnostic of a possible ectopic  pregnancy.  This was in the above described mass.  Given the high likely  diagnosis of ectopic pregnancy, the need for laparoscopy question laparotomy  was discussed with the patient.  The possibility of tubo-ovarian abscess and  the consequences were discussed with the patient.  Laparoscopy question  laparotomy was discussed in detail, and potential complications including,  but not limited to, injury to the bowel, bladder, ureters, possible need to  remove pelvic organs, possible need to perform a hysterectomy that would  result in permanent sterilization was discussed with the patient in detail.  A possible laparotomy was also discussed with the extended above risks.  The  possible need for a transfusion and sequela have also been discussed.  I  spoke with the patient's mother, and by telephone I reviewed the procedures  in a general fashion, and have answered completely the questions from the  patient and her mother.   IMPRESSION:  Ruptured ectopic pregnancy.  PLAN:  Diagnostic laparoscopy, question  laparotomy.  Leatha Gilding. Mezer, M.D.    HCM/MEDQ  D:  08/03/2002  T:  08/03/2002  Job:  [id number]

## 2011-03-27 NOTE — Op Note (Signed)
[place]  Patient:    Kaitlin Wong, Kaitlin Wong                     MRN: [id number] Adm. Date:  [id number] Attending:  Cameron Proud CC:         Carlisle Beers. Dorothyann Gibbs, M.D.   Operative Report  PREOPERATIVE DIAGNOSIS:  Right fifth rib osteochondroma.  POSTOPERATIVE DIAGNOSIS:  Right fifth rib osteochondroma.  PROCEDURE:  Resection of osteochondroma, right fifth anterior rib.  SURGEON:  D. Karle Plumber, M.D.  FIRST ASSISTANT:  Maxwell Marion, R.N.F.A.  ANESTHESIA:  General anesthesia.  DESCRIPTION OF PROCEDURE:  After adequate general anesthesia, the patient was elevated up 30-45 degrees with a roll and was prepped and draped in the usual sterile manner, and a right inframammary incision was made and dissection was carried down through the subcutaneous tissue, reflecting the breast anteriorly and going down to the fifth intercostal rib, where there was a multifaceted osteochondroma.  The pectoral muscle was dissected off the rib, and then a dissection was started superiorly and inferiorly of the osteochondroma, first superiorly, then using the Alexander periosteal elevator, resecting the periosteum off the rib for approximately 2 cm proximally, and then stripping the intercostal muscle off the inferior and superior intercostal muscle.  Then dissection went proximal on the rib, again dissecting it off with an Community education officer and reflecting it off.  The pleura was entered.  We entered a small amount inferiorly, a small pleura was entered inferiorly.  The ribs were resected, and the first rib with the first rib cutter, 2 cm superiorly and then 2 cm inferiorly to the osteochondroma, and again it was multifaceted both on the pleural side as well as the chest wall side of the rib.  After this had been done, the muscle was closed with interrupted 2-0 Vicryl, subcutaneous tissue with 3-0 Vicryl, and the subcuticular with 3-0 Vicryl.   A chest tube was then inserted through a separate stab wound, tied in place with 0 silk.  The patient was returned to the recovery room in stable condition. DD:  04/04/01 TD:  04/04/01 Job: [id number]

## 2011-03-27 NOTE — H&P (Signed)
NAME:  Kaitlin Wong, Kaitlin Wong                        ACCOUNT NO.:  [id number]   MEDICAL RECORD NO.:  [id number]                   PATIENT TYPE:  INP   LOCATION:  9107                                 FACILITY:  [facility]   PHYSICIAN:  Rudy Jew. Ashley Royalty, M.D.             DATE OF BIRTH:  1980-12-01   DATE OF ADMISSION:  07/30/2003  DATE OF DISCHARGE:                                HISTORY & PHYSICAL   HISTORY OF PRESENT ILLNESS:  This is a 30 year old gravida 5 para 1-0-3-1;  [facility] October 22, 2003; placing her at approximately [redacted] weeks gestation.  Prenatal care has been complicated by smoking and marijuana use during  pregnancy.  The patient presented for routine obstetrical office visit today  at which time no fetal heart tones were auscultated with the Doppler.  She  was subsequently sent to the [facility] for ultrasound which similarly  showed no evidence of any cardiac activity, i.e. intrauterine fetal demise.  The patient is hence admitted for induction of labor in order to accomplish  delivery.   MEDICATIONS:  None.   PAST MEDICAL HISTORY:  1. Medical:  Negative.  2. Surgical:  September 2003 - ectopic pregnancy (exact surgical procedure     unknown to the patient).   ALLERGIES:  None.   FAMILY HISTORY:  Noncontributory.   SOCIAL HISTORY:  The patient smokes one pack of cigarettes every three days.  She also admitted to marijuana use during the early part of her pregnancy.   REVIEW OF SYSTEMS:  Noncontributory.   PHYSICAL EXAMINATION:  GENERAL:  Well-developed, well-nourished, diminutive  black female, no acute distress.  VITAL SIGNS:  Afebrile; vital signs stable.  Blood pressure in the office  118/80.  SKIN:  Warm and dry without lesions.  LYMPH:  There is no supraclavicular, cervical, or inguinal adenopathy.  HEENT:  Normocephalic.  NECK:  Supple without thyromegaly.  CHEST:  Lungs are clear.  CARDIAC:  Regular rate and rhythm without murmurs, gallops, or rubs.  BREAST:  Deferred.  ABDOMEN:  Gravid with a fundal height of only approximately 22 cm.  As  mentioned above, no fetal heart tones could be auscultated with the Doppler.  MUSCULOSKELETAL:  Reveals 1+ dependent edema with no hyperreflexia.  PELVIC:  Sterile vaginal examination revealed the cervix to be closed and  soft.   IMPRESSION:  1. Intrauterine pregnancy at or about [redacted] weeks gestation.  2. Intrauterine fetal demise.  3. Smoker.  4. History of marijuana use.   PLAN:  1. Admit.  2. Check labs.  3. Will initiate induction of labor.                                               James A. Ashley Royalty, M.D.    JAM/MEDQ  D:  07/31/2003  T:  07/31/2003  Job:  [id number]

## 2011-08-20 LAB — CBC
HCT: 32.8 — ABNORMAL LOW
HCT: 35 — ABNORMAL LOW
Hemoglobin: 11.1 — ABNORMAL LOW
Hemoglobin: 11.6 — ABNORMAL LOW
Hemoglobin: 11.9 — ABNORMAL LOW
MCHC: 33.9
MCHC: 34
MCHC: 34.8
MCV: 107.8 — ABNORMAL HIGH
RBC: 3.16 — ABNORMAL LOW
RDW: 14.3 — ABNORMAL HIGH
RDW: 14.4 — ABNORMAL HIGH
WBC: 23.3 — ABNORMAL HIGH

## 2011-08-20 LAB — ANAEROBIC CULTURE

## 2011-08-20 LAB — BASIC METABOLIC PANEL
CO2: 24
Chloride: 102
Glucose, Bld: 87
Potassium: 4.2
Sodium: 134 — ABNORMAL LOW

## 2011-08-20 LAB — WOUND CULTURE

## 2011-08-20 LAB — COMPREHENSIVE METABOLIC PANEL
Alkaline Phosphatase: 71
BUN: 17
Glucose, Bld: 96
Potassium: 3.9
Total Protein: 6

## 2011-08-21 LAB — URINE MICROSCOPIC-ADD ON

## 2011-08-21 LAB — COMPREHENSIVE METABOLIC PANEL
ALT: 20
ALT: 23
ALT: 40 — ABNORMAL HIGH
ALT: 49 — ABNORMAL HIGH
AST: 22
AST: 23
AST: 35
AST: 36
AST: 36
AST: 37
AST: 46 — ABNORMAL HIGH
Albumin: 2.2 — ABNORMAL LOW
Albumin: 2.3 — ABNORMAL LOW
Albumin: 2.4 — ABNORMAL LOW
Albumin: 2.5 — ABNORMAL LOW
Albumin: 2.5 — ABNORMAL LOW
Albumin: 2.6 — ABNORMAL LOW
Albumin: 2.6 — ABNORMAL LOW
Albumin: 2.6 — ABNORMAL LOW
Albumin: 2.7 — ABNORMAL LOW
Alkaline Phosphatase: 111
Alkaline Phosphatase: 111
Alkaline Phosphatase: 113
Alkaline Phosphatase: 113
Alkaline Phosphatase: 98
BUN: 11
BUN: 4 — ABNORMAL LOW
BUN: 8
BUN: 9
CO2: 20
CO2: 20
CO2: 22
CO2: 22
CO2: 23
Calcium: 8.6
Calcium: 8.7
Calcium: 9
Calcium: 9.1
Chloride: 103
Chloride: 104
Chloride: 106
Chloride: 106
Chloride: 106
Chloride: 107
Creatinine, Ser: 0.53
Creatinine, Ser: 0.54
Creatinine, Ser: 0.6
Creatinine, Ser: 0.6
Creatinine, Ser: 0.62
Creatinine, Ser: 0.62
Creatinine, Ser: 0.76
GFR calc Af Amer: >60
GFR calc Af Amer: >60
GFR calc Af Amer: >60
GFR calc Af Amer: >60
GFR calc Af Amer: >60
GFR calc Af Amer: >60
GFR calc Af Amer: >60
GFR calc non Af Amer: >60
GFR calc non Af Amer: >60
GFR calc non Af Amer: >60
GFR calc non Af Amer: >60
GFR calc non Af Amer: >60
GFR calc non Af Amer: >60
Glucose, Bld: 124 — ABNORMAL HIGH
Glucose, Bld: 87
Glucose, Bld: 89
Glucose, Bld: 94
Potassium: 3.7
Potassium: 3.8
Potassium: 4
Potassium: 4.1
Potassium: 4.2
Sodium: 133 — ABNORMAL LOW
Sodium: 133 — ABNORMAL LOW
Sodium: 135
Sodium: 136
Sodium: 137
Total Bilirubin: 0.5
Total Bilirubin: 0.5
Total Bilirubin: 0.6
Total Bilirubin: 0.6
Total Bilirubin: 0.6
Total Bilirubin: 0.6
Total Protein: 4.6 — ABNORMAL LOW
Total Protein: 4.8 — ABNORMAL LOW
Total Protein: 4.8 — ABNORMAL LOW
Total Protein: 5 — ABNORMAL LOW
Total Protein: 5.5 — ABNORMAL LOW

## 2011-08-21 LAB — CBC
HCT: 31 — ABNORMAL LOW
HCT: 31.3 — ABNORMAL LOW
HCT: 32.8 — ABNORMAL LOW
HCT: 33.4 — ABNORMAL LOW
HCT: 34.3 — ABNORMAL LOW
HCT: 35.1 — ABNORMAL LOW
HCT: 36.4
Hemoglobin: 10.9 — ABNORMAL LOW
Hemoglobin: 11.9 — ABNORMAL LOW
Hemoglobin: 11.9 — ABNORMAL LOW
Hemoglobin: 12.2
MCHC: 34.6
MCHC: 34.7
MCHC: 34.7
MCHC: 34.7
MCHC: 35.3
MCHC: 35.5
MCV: 105.2 — ABNORMAL HIGH
MCV: 105.4 — ABNORMAL HIGH
MCV: 106.1 — ABNORMAL HIGH
MCV: 106.2 — ABNORMAL HIGH
MCV: 106.3 — ABNORMAL HIGH
MCV: 106.6 — ABNORMAL HIGH
MCV: 106.7 — ABNORMAL HIGH
MCV: 106.8 — ABNORMAL HIGH
MCV: 106.9 — ABNORMAL HIGH
MCV: 107.3 — ABNORMAL HIGH
Platelets: 121 — ABNORMAL LOW
Platelets: 125 — ABNORMAL LOW
Platelets: 130 — ABNORMAL LOW
Platelets: 132 — ABNORMAL LOW
Platelets: 133 — ABNORMAL LOW
Platelets: 139 — ABNORMAL LOW
Platelets: 140 — ABNORMAL LOW
RBC: 2.71 — ABNORMAL LOW
RBC: 3.12 — ABNORMAL LOW
RBC: 3.13 — ABNORMAL LOW
RBC: 3.23 — ABNORMAL LOW
RBC: 3.28 — ABNORMAL LOW
RBC: 3.39 — ABNORMAL LOW
RDW: 14.2 — ABNORMAL HIGH
RDW: 14.3 — ABNORMAL HIGH
RDW: 14.7 — ABNORMAL HIGH
RDW: 14.7 — ABNORMAL HIGH
RDW: 14.8 — ABNORMAL HIGH
WBC: 15.2 — ABNORMAL HIGH
WBC: 15.3 — ABNORMAL HIGH
WBC: 17.1 — ABNORMAL HIGH
WBC: 17.9 — ABNORMAL HIGH
WBC: 22.3 — ABNORMAL HIGH

## 2011-08-21 LAB — PROTEIN, URINE, 24 HOUR
Collection Interval-UPROT: 24
Protein, Urine: 26
Urine Total Volume-UPROT: 3050

## 2011-08-21 LAB — URINALYSIS, ROUTINE W REFLEX MICROSCOPIC
Bilirubin Urine: NEGATIVE
Bilirubin Urine: NEGATIVE
Glucose, UA: NEGATIVE
Glucose, UA: NEGATIVE
Ketones, ur: NEGATIVE
Protein, ur: 30 — AB
Protein, ur: 30 — AB
Urobilinogen, UA: 0.2
pH: 6

## 2011-08-21 LAB — URIC ACID
Uric Acid, Serum: 4.2
Uric Acid, Serum: 5

## 2011-08-21 LAB — CREATININE CLEARANCE, URINE, 24 HOUR
Creatinine Clearance: 150 — ABNORMAL HIGH
Creatinine, 24H Ur: 1296
Creatinine: 0.6
Urine Total Volume-CRCL: 3050

## 2011-08-21 LAB — URINE CULTURE: Colony Count: NO GROWTH

## 2011-08-21 LAB — DIFFERENTIAL
Basophils Relative: 0
Eosinophils Absolute: 0
Eosinophils Relative: 0
Lymphocytes Relative: 6 — ABNORMAL LOW
Neutrophils Relative %: 94 — ABNORMAL HIGH

## 2011-08-21 LAB — LACTATE DEHYDROGENASE
LDH: 196
LDH: 231

## 2011-08-21 LAB — RPR: RPR Ser Ql: NONREACTIVE

## 2011-08-21 LAB — APTT: aPTT: 26

## 2011-08-24 LAB — POCT URINALYSIS DIP (DEVICE)
Bilirubin Urine: NEGATIVE
Ketones, ur: NEGATIVE
Nitrite: NEGATIVE
Nitrite: NEGATIVE
Operator id: 148111
Operator id: 148111
Protein, ur: 30 — AB
Protein, ur: NEGATIVE
pH: 6
pH: 6.5

## 2011-08-25 LAB — POCT URINALYSIS DIP (DEVICE)
Ketones, ur: NEGATIVE
Nitrite: POSITIVE — AB
Operator id: 148111
Protein, ur: NEGATIVE
Urobilinogen, UA: 0.2
pH: 6

## 2011-08-27 LAB — POCT URINALYSIS DIP (DEVICE)
Nitrite: NEGATIVE
Operator id: 120861
Protein, ur: NEGATIVE
Urobilinogen, UA: 0.2
pH: 8.5 — ABNORMAL HIGH

## 2011-08-28 ENCOUNTER — Emergency Department (HOSPITAL_COMMUNITY): Payer: Medicaid Other

## 2011-08-28 ENCOUNTER — Emergency Department (HOSPITAL_COMMUNITY)
Admission: EM | Admit: 2011-08-28 | Discharge: 2011-08-28 | Disposition: A | Discharge status: Home / Self Care | Payer: Medicaid Other | Attending: Emergency Medicine | Admitting: Emergency Medicine

## 2011-08-28 DIAGNOSIS — I739 Peripheral vascular disease, unspecified: Secondary | ICD-10-CM | POA: Insufficient documentation

## 2011-08-28 DIAGNOSIS — R1110 Vomiting, unspecified: Secondary | ICD-10-CM | POA: Insufficient documentation

## 2011-08-28 DIAGNOSIS — R1013 Epigastric pain: Secondary | ICD-10-CM | POA: Insufficient documentation

## 2011-08-28 DIAGNOSIS — Z79899 Other long term (current) drug therapy: Secondary | ICD-10-CM | POA: Insufficient documentation

## 2011-08-28 DIAGNOSIS — F29 Unspecified psychosis not due to a substance or known physiological condition: Secondary | ICD-10-CM | POA: Insufficient documentation

## 2011-08-28 DIAGNOSIS — I509 Heart failure, unspecified: Secondary | ICD-10-CM | POA: Insufficient documentation

## 2011-08-28 DIAGNOSIS — I10 Essential (primary) hypertension: Secondary | ICD-10-CM | POA: Insufficient documentation

## 2011-08-28 DIAGNOSIS — R569 Unspecified convulsions: Secondary | ICD-10-CM | POA: Insufficient documentation

## 2011-08-28 LAB — URINALYSIS, ROUTINE W REFLEX MICROSCOPIC
Ketones, ur: NEGATIVE mg/dL
Leukocytes, UA: NEGATIVE
Nitrite: NEGATIVE
Protein, ur: 30 mg/dL — AB

## 2011-08-28 LAB — RAPID URINE DRUG SCREEN, HOSP PERFORMED
Amphetamines: NOT DETECTED
Barbiturates: NOT DETECTED
Tetrahydrocannabinol: POSITIVE — AB

## 2011-08-28 LAB — COMPREHENSIVE METABOLIC PANEL
AST: 22 U/L (ref 0–37)
CO2: 22 mEq/L (ref 19–32)
Calcium: 9.5 mg/dL (ref 8.4–10.5)
Creatinine, Ser: 0.7 mg/dL (ref 0.50–1.10)
GFR calc Af Amer: >90 mL/min (ref >90)
GFR calc non Af Amer: >90 mL/min (ref >90)
Glucose, Bld: 111 mg/dL — ABNORMAL HIGH (ref 70–99)
Total Protein: 7.1 g/dL (ref 6.0–8.3)

## 2011-08-28 LAB — LIPASE, BLOOD: Lipase: 24 U/L (ref 11–59)

## 2011-08-28 LAB — CBC
MCV: 102.2 fL — ABNORMAL HIGH (ref 78.0–100.0)
Platelets: 225 10*3/uL (ref 150–400)
RBC: 4.17 MIL/uL (ref 3.87–5.11)
RDW: 13.3 % (ref 11.5–15.5)
WBC: 19.6 10*3/uL — ABNORMAL HIGH (ref 4.0–10.5)

## 2011-08-28 LAB — DIFFERENTIAL
Basophils Absolute: 0 10*3/uL (ref 0.0–0.1)
Basophils Relative: 0 % (ref 0–1)
Eosinophils Absolute: 0.2 10*3/uL (ref 0.0–0.7)
Eosinophils Relative: 1 % (ref 0–5)
Lymphs Abs: 1.2 10*3/uL (ref 0.7–4.0)
Neutrophils Relative %: 89 % — ABNORMAL HIGH (ref 43–77)

## 2011-08-28 LAB — PROTIME-INR: Prothrombin Time: 13.7 seconds (ref 11.6–15.2)

## 2011-08-28 LAB — APTT: aPTT: 29 seconds (ref 24–37)

## 2011-08-31 NOTE — Consult Note (Signed)
NAMEMARLYN, Kaitlin Wong NO.:  [id number]  MEDICAL RECORD NO.:  [id number]  LOCATION:  [facility]                         FACILITY:  [facility]  PHYSICIAN:  Bessie Livingood P. Pearlean Brownie, MD    DATE OF BIRTH:  Jul 15, 1981  DATE OF CONSULTATION: DATE OF DISCHARGE:  08/28/2011                                CONSULTATION   REFERRING PHYSICIAN:  Forbes Cellar, MD  REASON FOR REFERRAL:  Seizure.  HISTORY OF PRESENT ILLNESS:  Ms. Wong is a 30 year old African American lady, who had what appears to be a witnessed seizure in sleep this morning.  She states she woke up at 7 and left her daughter go to school at 7:30 and went back to sleep.  At 9 a.m., her cousin who lives with her witnessed her having seizure-like activity.  She called EMS, but the patient woke up only after EMS arrived.  She was found to be confused and incoherent for several minutes after that and was initially combative with EMS and subsequently has been complaining of being tired and sleepy, but has now gradually returned back to her baseline.  She did not have any tongue bite, urinary incontinence, or injury.  The patient states she has not been sleeping well for the last several days and has a very limited sleep.  Her urine drug screen was positive for marijuana.  She denies abusing alcohol or cocaine.  She has no prior history of seizures.  There is no family history of anybody with seizures either.  PAST MEDICAL HISTORY:  Significant for: 1. Hypertension. 2. Peripheral vascular disease. 3. Congestive heart failure.  HOME MEDICATIONS:  Lasix, potassium, gabapentin.  SOCIAL HISTORY:  The patient lives at home.  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  PAST SURGICAL HISTORY:  Cyst removal for ectopic pregnancy.  PHYSICAL EXAMINATION:  GENERAL:  A frail young [place] American lady, currently not in distress. VITAL SIGNS:  Temperature 98.1; pulse rate is 100 per minute, regular; blood pressure 121/83,  respiratory rate 15 per minute, oxygen sats 99% on room air. HEENT:  Head is nontraumatic.  Hearing appears normal. NECK:  Supple.  There is no bruit. CARDIAC:  No murmur or gallop. LUNGS:  Clear to auscultation. ABDOMEN:  Soft, nontender. NEUROLOGIC:  Awake, alert; oriented to time, place, and person.  Speech and language appear normal.  There is no aphasia, apraxia, or dysarthria.  Eye movements are full range, no nystagmus.  She blinks to threat bilaterally.  Face is symmetric without weakness.  Motor system exam reveals no upper or lower extremity drift.  Symmetric and equal strength in all 4 limbs.  Plantars are downgoing.  DATA REVIEWED:  Noncontrast CAT scan of the head done today is normal. The patient had a previous MRI scan of the brain in 2004, which was normal.  WBC count is elevated at 19.6.  Otherwise, labs are unremarkable.  Electrolytes are normal.  Urine drug screen is positive only for cannabis.  IMPRESSION:  A 30-year-lady with what appears to be a single witnessed generalized tonic-clonic seizure in sleep.  No obvious trigger except possibly sleep deprivation and cannabis use.  PLAN:  I had a long discussion with the  patient and son who was present at the bedside with regards to the risks of epilepsy.  Chances of recurrent unprovoked seizure is being currently 50%.  I would recommend further evaluation by checking EEG and MRI scan of the brain.  I have advised her to regularize her sleep-wake cycles, to sleep at least 6-8 hours every night.  I have advised her not to drive as per [facility] law for 3-6 months.  I have advised her to avoid further seizure triggers like alcohol, cannabis, tobacco abuse, and regularize her sleep-wake cycle.  She can follow up with me in the Coumadin office for further evaluation and treatment of his seizures.  If she has further unprovoked seizure, she will need to be on anticonvulsant medications for long term.  The patient  and family understand and answered the questions.     Kla Bily P. Pearlean Brownie, MD     PPS/MEDQ  D:  08/28/2011  T:  08/29/2011  Job:  [id number]  Electronically Signed by Delia Heady MD on 08/31/2011 04:56:46 PM

## 2012-03-07 ENCOUNTER — Encounter (HOSPITAL_COMMUNITY): Payer: Self-pay | Admitting: *Deleted

## 2012-03-07 ENCOUNTER — Emergency Department (HOSPITAL_COMMUNITY): Payer: Medicaid Other

## 2012-03-07 ENCOUNTER — Emergency Department (HOSPITAL_COMMUNITY)
Admission: EM | Admit: 2012-03-07 | Discharge: 2012-03-07 | Disposition: A | Discharge status: Home / Self Care | Payer: Medicaid Other | Attending: Emergency Medicine | Admitting: Emergency Medicine

## 2012-03-07 DIAGNOSIS — R6883 Chills (without fever): Secondary | ICD-10-CM | POA: Insufficient documentation

## 2012-03-07 DIAGNOSIS — R079 Chest pain, unspecified: Secondary | ICD-10-CM | POA: Insufficient documentation

## 2012-03-07 DIAGNOSIS — Z79899 Other long term (current) drug therapy: Secondary | ICD-10-CM | POA: Insufficient documentation

## 2012-03-07 DIAGNOSIS — I10 Essential (primary) hypertension: Secondary | ICD-10-CM | POA: Insufficient documentation

## 2012-03-07 DIAGNOSIS — R0602 Shortness of breath: Secondary | ICD-10-CM | POA: Insufficient documentation

## 2012-03-07 HISTORY — DX: Endocarditis, valve unspecified: I38

## 2012-03-07 HISTORY — DX: Essential (primary) hypertension: I10

## 2012-03-07 HISTORY — DX: Restless legs syndrome: G25.81

## 2012-03-07 LAB — TROPONIN I
Troponin I: 0.34 ng/mL (ref <0.30)
Troponin I: <0.3 ng/mL (ref <0.30)

## 2012-03-07 LAB — DIFFERENTIAL
Basophils Absolute: 0 10*3/uL (ref 0.0–0.1)
Eosinophils Relative: 5 % (ref 0–5)
Lymphs Abs: 2.6 10*3/uL (ref 0.7–4.0)
Monocytes Absolute: 0.7 10*3/uL (ref 0.1–1.0)
Monocytes Relative: 7 % (ref 3–12)
Neutrophils Relative %: 61 % (ref 43–77)

## 2012-03-07 LAB — BASIC METABOLIC PANEL: Potassium: 5.8 mEq/L — ABNORMAL HIGH (ref 3.5–5.1)

## 2012-03-07 LAB — POCT I-STAT, CHEM 8
Chloride: 105 mEq/L (ref 96–112)
HCT: 42 % (ref 36.0–46.0)
Potassium: 3.5 mEq/L (ref 3.5–5.1)

## 2012-03-07 LAB — CBC
MCH: 35.2 pg — ABNORMAL HIGH (ref 26.0–34.0)
Platelets: ADEQUATE 10*3/uL (ref 150–400)
RBC: 4.01 MIL/uL (ref 3.87–5.11)
RDW: 13.9 % (ref 11.5–15.5)
WBC: 9.8 10*3/uL (ref 4.0–10.5)

## 2012-03-07 LAB — POTASSIUM: Potassium: 3.7 mEq/L (ref 3.5–5.1)

## 2012-03-07 MED ORDER — OXYCODONE-ACETAMINOPHEN 5-325 MG PO TABS
1.0000 | ORAL_TABLET | Freq: Once | ORAL | Status: AC
  Administered 2012-03-07: 1 via ORAL
  Filled 2012-03-07: qty 1

## 2012-03-07 MED ORDER — OXYCODONE-ACETAMINOPHEN 5-325 MG PO TABS: 1.0000 | ORAL_TABLET | Freq: Four times a day (QID) | ORAL | Status: AC | PRN

## 2012-03-07 MED ORDER — MORPHINE SULFATE 4 MG/ML IJ SOLN: 4.0000 mg | Freq: Once | INTRAMUSCULAR | Status: DC

## 2012-03-07 NOTE — Discharge Instructions (Signed)
Chest Pain (Nonspecific) It is often hard to give a specific diagnosis for the cause of chest pain. There is always a chance that your pain could be related to something serious, such as a heart attack or a blood clot in the lungs. You need to follow up with your caregiver for further evaluation. CAUSES   Heartburn.   Pneumonia or bronchitis.   Anxiety or stress.   Inflammation around your heart (pericarditis) or lung (pleuritis or pleurisy).   A blood clot in the lung.   A collapsed lung (pneumothorax). It can develop suddenly on its own (spontaneous pneumothorax) or from injury (trauma) to the chest.   Shingles infection (herpes zoster virus).  The chest wall is composed of bones, muscles, and cartilage. Any of these can be the source of the pain.  The bones can be bruised by injury.   The muscles or cartilage can be strained by coughing or overwork.   The cartilage can be affected by inflammation and become sore (costochondritis).  DIAGNOSIS  Lab tests or other studies, such as X-rays, electrocardiography, stress testing, or cardiac imaging, may be needed to find the cause of your pain.  TREATMENT   Treatment depends on what may be causing your chest pain. Treatment may include:   Acid blockers for heartburn.   Anti-inflammatory medicine.   Pain medicine for inflammatory conditions.   Antibiotics if an infection is present.   You may be advised to change lifestyle habits. This includes stopping smoking and avoiding alcohol, caffeine, and chocolate.   You may be advised to keep your head raised (elevated) when sleeping. This reduces the chance of acid going backward from your stomach into your esophagus.   Most of the time, nonspecific chest pain will improve within 2 to 3 days with rest and mild pain medicine.  HOME CARE INSTRUCTIONS   If antibiotics were prescribed, take your antibiotics as directed. Finish them even if you start to feel better.   For the next few  days, avoid physical activities that bring on chest pain. Continue physical activities as directed.   Do not smoke.   Avoid drinking alcohol.   Only take over-the-counter or prescription medicine for pain, discomfort, or fever as directed by your caregiver.   Follow your caregiver's suggestions for further testing if your chest pain does not go away.   Keep any follow-up appointments you made. If you do not go to an appointment, you could develop lasting (chronic) problems with pain. If there is any problem keeping an appointment, you must call to reschedule.  SEEK MEDICAL CARE IF:   You think you are having problems from the medicine you are taking. Read your medicine instructions carefully.   Your chest pain does not go away, even after treatment.   You develop a rash with blisters on your chest.  SEEK IMMEDIATE MEDICAL CARE IF:   You have increased chest pain or pain that spreads to your arm, neck, jaw, back, or abdomen.   You develop shortness of breath, an increasing cough, or you are coughing up blood.   You have severe back or abdominal pain, feel nauseous, or vomit.   You develop severe weakness, fainting, or chills.   You have a fever.  THIS IS AN EMERGENCY. Do not wait to see if the pain will go away. Get medical help at once. Call your local emergency services (911 in [place] Do not drive yourself to the hospital. MAKE SURE YOU:   Understand these instructions.     Will watch your condition.   Will get help right away if you are not doing well or get worse.  Document Released: 08/05/2005 Document Revised: 10/15/2011 Document Reviewed: 05/31/2008 [employer] Patient Information 2012 [employer]. 

## 2012-03-07 NOTE — ED Provider Notes (Signed)
History     CSN: [id number]  Arrival date & time 03/07/12  1315   First MD Initiated Contact with Patient 03/07/12 1630      Chief Complaint  Patient presents with  . Chest Pain    (Consider location/radiation/quality/duration/timing/severity/associated sxs/prior treatment) The history is provided by the patient.   patient started having sharp upper chest pain that began last night. Is worse with movement or palpation. She feels short of breath. His worse with deep breathing. A cough. No fevers. No trauma. She seen a cardiologist before and this is a cart that is bacterial endocarditis prophylaxis. No coronary artery disease she knows of. I discuss with [facility]. She has a recent negative stress test. She does not have a history of endocarditis. She has aortic insufficiency.  Past Medical History  Diagnosis Date  . Endocarditis   . Hypertension   . RLS (restless legs syndrome)     Past Surgical History  Procedure Date  . Oophrectomy     left    No family history on file.  History  Substance Use Topics  . Smoking status: Current Everyday Smoker -- 0.5 packs/day  . Smokeless tobacco: Not on file  . Alcohol Use: No    OB History    Grav Para Term Preterm Abortions TAB SAB Ect Mult Living                  Review of Systems  Constitutional: Positive for chills. Negative for activity change and appetite change.  HENT: Negative for neck stiffness.   Eyes: Negative for pain.  Respiratory: Positive for shortness of breath. Negative for chest tightness.   Cardiovascular: Positive for chest pain. Negative for leg swelling.  Gastrointestinal: Negative for nausea, vomiting, abdominal pain and diarrhea.  Genitourinary: Negative for flank pain.  Musculoskeletal: Negative for back pain.  Skin: Negative for rash.  Neurological: Negative for weakness, numbness and headaches.  Psychiatric/Behavioral: Negative for behavioral problems.    Allergies  Review of  patient's allergies indicates no known allergies.  Home Medications   Current Outpatient Rx  Name Route Sig Dispense Refill  . AMITRIPTYLINE HCL 25 MG PO TABS Oral Take 25 mg by mouth at bedtime.    Marland Kitchen DICLOFENAC SODIUM 50 MG PO TBEC Oral Take 50 mg by mouth 2 (two) times daily.    . FUROSEMIDE 40 MG PO TABS Oral Take 40 mg by mouth daily.    Marland Kitchen GABAPENTIN 100 MG PO CAPS Oral Take 100 mg by mouth 3 (three) times daily.    Marland Kitchen LOSARTAN POTASSIUM 25 MG PO TABS Oral Take 25 mg by mouth daily.    Marland Kitchen POTASSIUM CHLORIDE CRYS ER 20 MEQ PO TBCR Oral Take 20 mEq by mouth 2 (two) times daily.    . OXYCODONE-ACETAMINOPHEN 5-325 MG PO TABS Oral Take 1-2 tablets by mouth every 6 (six) hours as needed for pain. 15 tablet 0    BP 137/73  Pulse 90  Temp(Src) 98.6 F (37 C) (Oral)  Resp 14  Wt 119 lb (53.978 kg)  SpO2 100%  LMP 02/29/2012  Physical Exam  Nursing note and vitals reviewed. Constitutional: She is oriented to person, place, and time. She appears well-developed and well-nourished.  HENT:  Head: Normocephalic and atraumatic.  Eyes: EOM are normal. Pupils are equal, round, and reactive to light.  Neck: Normal range of motion. Neck supple.  Cardiovascular: Normal rate, regular rhythm and normal heart sounds.   No murmur heard. Pulmonary/Chest: Effort normal  and breath sounds normal. No respiratory distress. She has no wheezes. She has no rales. She exhibits tenderness.       Tenderness to upper midline chest. No rash. Lungs are clear.  Abdominal: Soft. Bowel sounds are normal. She exhibits no distension. There is no tenderness. There is no rebound and no guarding.  Musculoskeletal: Normal range of motion.  Neurological: She is alert and oriented to person, place, and time. No cranial nerve deficit.  Skin: Skin is warm and dry.  Psychiatric: She has a normal mood and affect. Her speech is normal.    ED Course  Procedures (including critical care time)  Labs Reviewed  CBC - Abnormal;  Notable for the following:    MCV 101.7 (*)    MCH 35.2 (*)    All other components within normal limits  TROPONIN I - Abnormal; Notable for the following:    Troponin I 0.34 (*)    All other components within normal limits  BASIC METABOLIC PANEL - Abnormal; Notable for the following:    Potassium 5.8 (*) HEMOLYZED SPECIMEN, RESULTS MAY BE AFFECTED   All other components within normal limits  DIFFERENTIAL  D-DIMER, QUANTITATIVE  POTASSIUM  TROPONIN I  POCT I-STAT, CHEM 8   Dg Chest 2 View  03/07/2012  *RADIOLOGY REPORT*  Clinical Data: Shortness of breath, chest pain and cough.  CHEST - 2 VIEW  Comparison: None.  Findings: Trachea is midline.  Heart size normal.  Probable nipple shadows project over the lower hemithoraces, left greater than right.  A small nodular density projects in the lower right hemithorax, between the right fifth and sixth anterior ribs. The adjacent right 5th anterior rib is irregular.  Lungs are otherwise clear.  No pleural fluid.  Exostoses are seen off the right 3rd anterolateral rib and proximal right humerus.  IMPRESSION:  1.  Small nodular density projecting over the lower right hemithorax, in the region of an irregular 5th anterior rib.  Follow- up PA and lateral views of the chest could be performed in 3-6 months, as clinically indicated.  If a more aggressive approach is desired, non emergent CT chest without contrast is suggested. 2.  Exostoses involving the right third rib and proximal right humerus.  Original Report Authenticated By: Reyes Ivan, M.D.     1. Chest pain      Date: 03/07/2012  Rate: 83  Rhythm: normal sinus rhythm  QRS Axis: normal  Intervals: normal  ST/T Wave abnormalities: nonspecific ST/T changes and early repolarization  Conduction Disutrbances:none  Narrative Interpretation: nonspecific changes.  Old EKG Reviewed: changes noted    MDM  Patient was sharp chest pain. Reproducible on palpation. EKG shows some chronic rib  changes. You'll need to be followed. Initial troponin was slightly elevated at 0.34. Initial potassium was also elevated, but was hemolyzed. After discussion with cardiology, decision was made to recheck the troponin. She had negative stress test a month ago. The repeat troponin was normal. Her EKG does not show any clear infarct. With the reproducible chest pain I think this is more likely chest wall pain. She'll be discharged home to followup with Dr. Herbie Baltimore, her cardiologist        Juliet Rude. Rubin Payor, MD 03/07/12 2046

## 2012-03-07 NOTE — ED Notes (Signed)
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

## 2012-03-07 NOTE — ED Notes (Signed)
Pt medicated and given fluid challenge.

## 2012-03-07 NOTE — ED Notes (Signed)
Pt c/o chest pain in med-sternal chest to left chest pain is described as pressure and sharp, pt states chest pain began last night worsening today pt reports nausea and shortness of breath at start of pain, none today. 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

## 2012-03-11 ENCOUNTER — Other Ambulatory Visit: Payer: Self-pay

## 2012-03-11 DIAGNOSIS — M7989 Other specified soft tissue disorders: Secondary | ICD-10-CM

## 2012-04-20 ENCOUNTER — Encounter: Payer: Self-pay | Admitting: Vascular Surgery

## 2012-04-21 ENCOUNTER — Encounter: Payer: Self-pay | Admitting: Vascular Surgery

## 2012-04-21 ENCOUNTER — Encounter: Payer: Medicaid Other | Admitting: Vascular Surgery

## 2012-04-21 ENCOUNTER — Ambulatory Visit (INDEPENDENT_AMBULATORY_CARE_PROVIDER_SITE_OTHER): Payer: Medicaid Other | Admitting: Vascular Surgery

## 2012-04-21 VITALS — BP 100/60 | HR 64 | Resp 18 | Ht 60.0 in | Wt 108.6 lb

## 2012-04-21 DIAGNOSIS — M79609 Pain in unspecified limb: Secondary | ICD-10-CM

## 2012-04-21 DIAGNOSIS — M7989 Other specified soft tissue disorders: Secondary | ICD-10-CM

## 2012-04-21 NOTE — Progress Notes (Signed)
The patient presents for evaluation of lower surety pain. She is a 31 year old female with plate of multiple problems with her legs. She has been diagnosed with restless leg syndrome. She reports unusual sensations of achy sensations mostly in her calves and can extend up into her thighs as well. He does have some swelling which is worse at the end of the day. She does not have any history of DVT he does not have a history of venous varicosities. 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 It is unclear as to what that was the cause of these bone cyst.  Past Medical History  Diagnosis Date  . Endocarditis   . Hypertension   . RLS (restless legs syndrome)   . Aortic valve disease   . History of seizures   . Leg pain, bilateral     L>R  . Leg swelling     L>R    History  Substance Use Topics  . Smoking status: Current Everyday Smoker -- 0.5 packs/day  . Smokeless tobacco: Not on file  . Alcohol Use: No    History reviewed. No pertinent family history.  No Known Allergies  Current outpatient prescriptions:amitriptyline (ELAVIL) 25 MG tablet, Take 25 mg by mouth at bedtime., Disp: , Rfl: ;  diclofenac (VOLTAREN) 50 MG EC tablet, Take 50 mg by mouth 2 (two) times daily., Disp: , Rfl: ;  furosemide (LASIX) 40 MG tablet, Take 40 mg by mouth daily., Disp: , Rfl: ;  gabapentin (NEURONTIN) 100 MG capsule, Take 100 mg by mouth 3 (three) times daily., Disp: , Rfl:  losartan (COZAAR) 25 MG tablet, Take 25 mg by mouth daily., Disp: , Rfl: ;  potassium chloride SA (K-DUR,KLOR-CON) 20 MEQ tablet, Take 10 mEq by mouth 2 (two) times daily. , Disp: , Rfl:   BP 100/60  Pulse 64  Resp 18  Ht 5' (1.524 m)  Wt 108 lb 9.6 oz (49.261 kg)  BMI 21.21 kg/m2  Body mass index is 21.21 kg/(m^2).       Review of systems noted for a weakness in arms and legs and pain and swelling in both lower extremities  otherwise negative  Exam well-developed thin black female no acute distress Chest clear bilaterally without rales or wheezes Heart regular rate and rhythm Pulse status 2+ radial and 2+ posterior tibial pulses bilaterally Neurologically she is grossly intact She does not have any evidence of telangiectasia or varicosities bilaterally. She does have some mild bilateral lower extremity swelling.   She underwent a screening ultrasound was by myself with the SonoSite imager this showed no evidence of dilated saphenous vein or other subcutaneous surface veins.  Impression and plan unusual complex of pain and swelling in both lower extremities's with history of prior bone cyst removal. She did not have a formal duplex today That this would tissue show some deep venous reflux. I explained that this would not alter our treatment recommendations since there in all likelihood would not be significant reflux. I did explain that her only option would be elevation and compression. She has tried compression garments in the past but cannot tolerate these. I reassured her that I did not feel that this was a significant venous or arterial problem. She will see [place] on an as-needed basis

## 2012-08-15 DIAGNOSIS — I10 Essential (primary) hypertension: Secondary | ICD-10-CM

## 2012-08-15 HISTORY — DX: Essential (primary) hypertension: I10

## 2012-11-21 ENCOUNTER — Emergency Department (HOSPITAL_COMMUNITY): Payer: Medicaid Other

## 2012-11-21 ENCOUNTER — Encounter (HOSPITAL_COMMUNITY): Payer: Self-pay | Admitting: *Deleted

## 2012-11-21 DIAGNOSIS — Z8739 Personal history of other diseases of the musculoskeletal system and connective tissue: Secondary | ICD-10-CM | POA: Insufficient documentation

## 2012-11-21 DIAGNOSIS — I10 Essential (primary) hypertension: Secondary | ICD-10-CM | POA: Insufficient documentation

## 2012-11-21 DIAGNOSIS — F17200 Nicotine dependence, unspecified, uncomplicated: Secondary | ICD-10-CM | POA: Insufficient documentation

## 2012-11-21 DIAGNOSIS — R079 Chest pain, unspecified: Secondary | ICD-10-CM | POA: Insufficient documentation

## 2012-11-21 DIAGNOSIS — Z79899 Other long term (current) drug therapy: Secondary | ICD-10-CM | POA: Insufficient documentation

## 2012-11-21 DIAGNOSIS — G40909 Epilepsy, unspecified, not intractable, without status epilepticus: Secondary | ICD-10-CM | POA: Insufficient documentation

## 2012-11-21 DIAGNOSIS — R0602 Shortness of breath: Secondary | ICD-10-CM | POA: Insufficient documentation

## 2012-11-21 DIAGNOSIS — Z8679 Personal history of other diseases of the circulatory system: Secondary | ICD-10-CM | POA: Insufficient documentation

## 2012-11-21 LAB — CBC
HCT: 37.9 % (ref 36.0–46.0)
Hemoglobin: 13.3 g/dL (ref 12.0–15.0)
RBC: 3.78 MIL/uL — ABNORMAL LOW (ref 3.87–5.11)
WBC: 11.5 10*3/uL — ABNORMAL HIGH (ref 4.0–10.5)

## 2012-11-21 LAB — BASIC METABOLIC PANEL
BUN: 10 mg/dL (ref 6–23)
CO2: 24 mEq/L (ref 19–32)
Calcium: 9.5 mg/dL (ref 8.4–10.5)
GFR calc non Af Amer: >90 mL/min (ref >90)
Glucose, Bld: 94 mg/dL (ref 70–99)

## 2012-11-21 NOTE — ED Notes (Signed)
Pt states she has had left sided CP since yesterday associated with SOB.  States she thinks her friend saw her have a seizure tonight after she fell asleep.

## 2012-11-22 ENCOUNTER — Emergency Department (HOSPITAL_COMMUNITY)
Admission: EM | Admit: 2012-11-22 | Discharge: 2012-11-22 | Disposition: A | Discharge status: Home / Self Care | Payer: Medicaid Other | Attending: Emergency Medicine | Admitting: Emergency Medicine

## 2012-11-22 DIAGNOSIS — R079 Chest pain, unspecified: Secondary | ICD-10-CM

## 2012-11-22 HISTORY — DX: Unspecified convulsions: R56.9

## 2012-11-22 MED ORDER — KETOROLAC TROMETHAMINE 30 MG/ML IJ SOLN
30.0000 mg | Freq: Once | INTRAMUSCULAR | Status: AC
  Administered 2012-11-22: 30 mg via INTRAVENOUS
  Filled 2012-11-22: qty 1

## 2012-11-22 MED ORDER — OXYCODONE-ACETAMINOPHEN 5-325 MG PO TABS
1.0000 | ORAL_TABLET | Freq: Once | ORAL | Status: AC
  Administered 2012-11-22: 1 via ORAL
  Filled 2012-11-22: qty 1

## 2012-11-22 MED ORDER — OXYCODONE-ACETAMINOPHEN 5-325 MG PO TABS: 2.0000 | ORAL_TABLET | ORAL | Status: DC | PRN

## 2012-11-22 MED ORDER — SODIUM CHLORIDE 0.9 % IV BOLUS (SEPSIS)
500.0000 mL | Freq: Once | INTRAVENOUS | Status: AC
  Administered 2012-11-22: 500 mL via INTRAVENOUS

## 2012-11-22 NOTE — ED Notes (Signed)
PT. NOT READY FOR DISCHARGE AT THIS TIME , WILL MANAGE CHEST PAIN AND INFUSE IV NS BOLUS.

## 2012-11-24 NOTE — ED Provider Notes (Signed)
History     CSN: [id number]  Arrival date & time 11/21/12  2231   First MD Initiated Contact with Patient 11/22/12 0057      Chief Complaint  Patient presents with  . Chest Pain    (Consider location/radiation/quality/duration/timing/severity/associated sxs/prior treatment) Patient is a 32 y.o. female presenting with chest pain. The history is provided by the patient.  Chest Pain The chest pain began 12 - 24 hours ago. Chest pain occurs constantly. The chest pain is unchanged. The pain is associated with breathing. At its most intense, the pain is at 5/10. The pain is currently at 1/10. The quality of the pain is described as aching. The pain does not radiate. Primary symptoms include shortness of breath.  Her past medical history is significant for bicuspid aortic valve.     Past Medical History  Diagnosis Date  . Endocarditis   . Hypertension   . RLS (restless legs syndrome)   . Aortic valve disease   . History of seizures   . Leg pain, bilateral     L>R  . Leg swelling     L>R  . Seizures     Past Surgical History  Procedure Date  . Oophrectomy     left  . Cesearean section   . Tubal ligation   . Cystoscopy/retrograde/ureteroscopy/stone extraction with basket     per pt.  cysts removed from both legs, vagina, and chest    History reviewed. No pertinent family history.  History  Substance Use Topics  . Smoking status: Current Every Day Smoker -- 0.5 packs/day  . Smokeless tobacco: Not on file  . Alcohol Use: No    OB History    Grav Para Term Preterm Abortions TAB SAB Ect Mult Living                  Review of Systems  Respiratory: Positive for shortness of breath.   Cardiovascular: Positive for chest pain.  All other systems reviewed and are negative.    Allergies  Morphine and related  Home Medications   Current Outpatient Rx  Name  Route  Sig  Dispense  Refill  . FUROSEMIDE 40 MG PO TABS   Oral   Take 40 mg by mouth daily.           Marland Kitchen GABAPENTIN 100 MG PO CAPS   Oral   Take 300 mg by mouth 3 (three) times daily.          Marland Kitchen LOSARTAN POTASSIUM 25 MG PO TABS   Oral   Take 25 mg by mouth daily.         Marland Kitchen POTASSIUM CHLORIDE CRYS ER 20 MEQ PO TBCR   Oral   Take 20 mEq by mouth 2 (two) times daily.          . OXYCODONE-ACETAMINOPHEN 5-325 MG PO TABS   Oral   Take 2 tablets by mouth every 4 (four) hours as needed for pain.   15 tablet   0     BP 129/84  Pulse 100  Temp 98 F (36.7 C) (Oral)  Resp 20  SpO2 100%  LMP 11/21/2012  Physical Exam  Nursing note and vitals reviewed. Constitutional: She is oriented to person, place, and time. She appears well-developed and well-nourished.  HENT:  Head: Normocephalic and atraumatic.  Eyes: Conjunctivae normal and EOM are normal. Pupils are equal, round, and reactive to light.  Neck: Normal range of motion.  Cardiovascular: Normal rate, regular rhythm and normal  heart sounds.   Pulmonary/Chest: Effort normal and breath sounds normal.  Abdominal: Soft. Bowel sounds are normal.  Musculoskeletal: Normal range of motion.  Neurological: She is alert and oriented to person, place, and time.  Skin: Skin is warm and dry.  Psychiatric: She has a normal mood and affect. Her behavior is normal.    ED Course  Procedures (including critical care time)  Labs Reviewed  CBC - Abnormal; Notable for the following:    WBC 11.5 (*)     RBC 3.78 (*)     MCV 100.3 (*)     MCH 35.2 (*)     All other components within normal limits  BASIC METABOLIC PANEL  POCT I-STAT TROPONIN I  LAB REPORT - SCANNED   No results found.   1. Chest pain     Date: 11/24/2012  Rate: 94  Rhythm: normal sinus rhythm  QRS Axis: normal  Intervals: normal  ST/T Wave abnormalities: normal  Conduction Disutrbances: none  Narrative Interpretation: unremarkable     MDM  + chest pain.  Improved.  ce neg.  No leg swelling, no nonpleuritic.  Will dc to fu with pmd,  Ret new/worsening  sxs        Amorita Vanrossum Lytle Michaels, MD 11/24/12 2306

## 2013-03-14 ENCOUNTER — Other Ambulatory Visit (HOSPITAL_COMMUNITY): Payer: Self-pay | Admitting: Cardiology

## 2013-03-14 DIAGNOSIS — I350 Nonrheumatic aortic (valve) stenosis: Secondary | ICD-10-CM

## 2013-03-14 DIAGNOSIS — R609 Edema, unspecified: Secondary | ICD-10-CM

## 2013-03-14 DIAGNOSIS — R0602 Shortness of breath: Secondary | ICD-10-CM

## 2013-03-20 ENCOUNTER — Ambulatory Visit (HOSPITAL_COMMUNITY)
Admission: RE | Admit: 2013-03-20 | Discharge: 2013-03-20 | Disposition: A | Discharge status: Home / Self Care | Payer: Medicaid Other | Source: Ambulatory Visit | Attending: Cardiovascular Disease | Admitting: Cardiovascular Disease

## 2013-03-20 DIAGNOSIS — R0989 Other specified symptoms and signs involving the circulatory and respiratory systems: Secondary | ICD-10-CM | POA: Insufficient documentation

## 2013-03-20 DIAGNOSIS — E785 Hyperlipidemia, unspecified: Secondary | ICD-10-CM | POA: Insufficient documentation

## 2013-03-20 DIAGNOSIS — R0609 Other forms of dyspnea: Secondary | ICD-10-CM | POA: Insufficient documentation

## 2013-03-20 DIAGNOSIS — I080 Rheumatic disorders of both mitral and aortic valves: Secondary | ICD-10-CM | POA: Insufficient documentation

## 2013-03-20 DIAGNOSIS — I350 Nonrheumatic aortic (valve) stenosis: Secondary | ICD-10-CM

## 2013-03-20 DIAGNOSIS — R0602 Shortness of breath: Secondary | ICD-10-CM

## 2013-03-20 DIAGNOSIS — I079 Rheumatic tricuspid valve disease, unspecified: Secondary | ICD-10-CM | POA: Insufficient documentation

## 2013-03-20 DIAGNOSIS — R609 Edema, unspecified: Secondary | ICD-10-CM

## 2013-03-20 NOTE — Progress Notes (Signed)
2D Echo Performed 03/20/2013    Dannika Hilgeman, RCS  

## 2013-03-28 ENCOUNTER — Encounter (HOSPITAL_COMMUNITY): Payer: Self-pay | Admitting: Vascular Surgery

## 2013-03-28 ENCOUNTER — Emergency Department (HOSPITAL_COMMUNITY)
Admission: EM | Admit: 2013-03-28 | Discharge: 2013-03-28 | Disposition: A | Discharge status: Home / Self Care | Payer: Medicaid Other | Attending: Emergency Medicine | Admitting: Emergency Medicine

## 2013-03-28 ENCOUNTER — Emergency Department (HOSPITAL_COMMUNITY): Payer: Medicaid Other

## 2013-03-28 DIAGNOSIS — Z8679 Personal history of other diseases of the circulatory system: Secondary | ICD-10-CM | POA: Insufficient documentation

## 2013-03-28 DIAGNOSIS — G40909 Epilepsy, unspecified, not intractable, without status epilepticus: Secondary | ICD-10-CM | POA: Insufficient documentation

## 2013-03-28 DIAGNOSIS — R569 Unspecified convulsions: Secondary | ICD-10-CM

## 2013-03-28 DIAGNOSIS — I10 Essential (primary) hypertension: Secondary | ICD-10-CM | POA: Insufficient documentation

## 2013-03-28 DIAGNOSIS — F17200 Nicotine dependence, unspecified, uncomplicated: Secondary | ICD-10-CM | POA: Insufficient documentation

## 2013-03-28 DIAGNOSIS — Z79899 Other long term (current) drug therapy: Secondary | ICD-10-CM | POA: Insufficient documentation

## 2013-03-28 DIAGNOSIS — Z3202 Encounter for pregnancy test, result negative: Secondary | ICD-10-CM | POA: Insufficient documentation

## 2013-03-28 DIAGNOSIS — Z8669 Personal history of other diseases of the nervous system and sense organs: Secondary | ICD-10-CM | POA: Insufficient documentation

## 2013-03-28 LAB — COMPREHENSIVE METABOLIC PANEL
AST: 14 U/L (ref 0–37)
BUN: 6 mg/dL (ref 6–23)
CO2: 20 mEq/L (ref 19–32)
Calcium: 9.2 mg/dL (ref 8.4–10.5)
Chloride: 103 mEq/L (ref 96–112)
Creatinine, Ser: 0.65 mg/dL (ref 0.50–1.10)
GFR calc Af Amer: >90 mL/min (ref >90)
GFR calc non Af Amer: >90 mL/min (ref >90)
Glucose, Bld: 113 mg/dL — ABNORMAL HIGH (ref 70–99)
Total Bilirubin: 0.3 mg/dL (ref 0.3–1.2)

## 2013-03-28 LAB — CBC WITH DIFFERENTIAL/PLATELET
Basophils Absolute: 0 10*3/uL (ref 0.0–0.1)
Eosinophils Relative: 1 % (ref 0–5)
HCT: 41.2 % (ref 36.0–46.0)
Hemoglobin: 14.5 g/dL (ref 12.0–15.0)
Lymphocytes Relative: 7 % — ABNORMAL LOW (ref 12–46)
MCV: 100.2 fL — ABNORMAL HIGH (ref 78.0–100.0)
Monocytes Absolute: 0.5 10*3/uL (ref 0.1–1.0)
Monocytes Relative: 3 % (ref 3–12)
RDW: 13.8 % (ref 11.5–15.5)
WBC: 15.3 10*3/uL — ABNORMAL HIGH (ref 4.0–10.5)

## 2013-03-28 LAB — URINE MICROSCOPIC-ADD ON

## 2013-03-28 LAB — RAPID URINE DRUG SCREEN, HOSP PERFORMED: Opiates: NOT DETECTED

## 2013-03-28 LAB — URINALYSIS, ROUTINE W REFLEX MICROSCOPIC
Leukocytes, UA: NEGATIVE
Nitrite: NEGATIVE
Protein, ur: 30 mg/dL — AB
Specific Gravity, Urine: 1.018 (ref 1.005–1.030)
Urobilinogen, UA: 0.2 mg/dL (ref 0.0–1.0)

## 2013-03-28 LAB — PREGNANCY, URINE: Preg Test, Ur: NEGATIVE

## 2013-03-28 MED ORDER — GADOBENATE DIMEGLUMINE 529 MG/ML IV SOLN
10.0000 mL | Freq: Once | INTRAVENOUS | Status: AC
  Administered 2013-03-28: 10 mL via INTRAVENOUS

## 2013-03-28 NOTE — ED Notes (Signed)
Lab at bedside

## 2013-03-28 NOTE — ED Provider Notes (Signed)
  Physical Exam  BP 94/45  Pulse 93  Temp(Src) 98.4 F (36.9 C) (Oral)  Resp 14  SpO2 99%  LMP 03/26/2013  Physical Exam  ED Course  Procedures Asked to review MRI if negative to be DC home with neuro FU if positive to start meds and FU neuro MDM MRI neg      Arman Filter, NP 03/28/13 2108

## 2013-03-28 NOTE — ED Notes (Signed)
Pt transported to radiology.

## 2013-03-28 NOTE — ED Provider Notes (Addendum)
History     CSN: [id number]  Arrival date & time 03/28/13  1237   First MD Initiated Contact with Patient 03/28/13 1346      Chief Complaint  Patient presents with  . Seizures    (Consider location/radiation/quality/duration/timing/severity/associated sxs/prior treatment) HPI  Patient was brought in by EMS after having a witnessed seizure at home. However nobody to witness the seizure is here in ED with the patient today. EMS reported they were told the seizure lasted 5-10 minutes. The seizure occurred but she was in bed. She denies any incontinence. EMS report she was combative in route but she is alert and cooperative now. Patient reports she  had her first seizure in October 2012. At that time neurology was consulted and it was felt she possibly had sleep deprivation as the etiology of her seizure. They recommended no medications at that time however if she had another seizure they recommended EEG and starting on medications. Patient states last night she was unable to sleep for no apparent reason. Otherwise she had been feeling well yesterday.  Patient noted to have a consult done by Dr. early, vascular surgery in June 2013 and patient does not have peripheral last for disease.  Patient had echocardiogram done on May 12 for evaluation for possible aortic stenosis.  PCP Dr Concepcion [facility] Dr Herbie Baltimore  Past Medical History  Diagnosis Date  . Endocarditis   . Hypertension   . RLS (restless legs syndrome)   . Aortic valve disease   . History of seizures   . Leg pain, bilateral     L>R  . Leg swelling     L>R  . Seizures     Past Surgical History  Procedure Laterality Date  . Oophrectomy      left  . Cesearean section    . Tubal ligation    . Cystoscopy/retrograde/ureteroscopy/stone extraction with basket      per pt.  cysts removed from both legs, vagina, and chest    No family history on file.  History  Substance Use Topics  . Smoking status: Current Every Day  Smoker -- 0.50 packs/day  . Smokeless tobacco: Not on file  . Alcohol Use: Yes     Comment: occasionally   quit drinking alcohol a few months ago Smokes one pack a day Unemployed  OB History   Grav Para Term Preterm Abortions TAB SAB Ect Mult Living                  Review of Systems  All other systems reviewed and are negative.    Allergies  Morphine and related  Home Medications   Current Outpatient Rx  Name  Route  Sig  Dispense  Refill  . furosemide (LASIX) 40 MG tablet   Oral   Take 40 mg by mouth daily as needed (for leg swelling).          . gabapentin (NEURONTIN) 300 MG capsule   Oral   Take 300 mg by mouth 3 (three) times daily.         Marland Kitchen losartan (COZAAR) 25 MG tablet   Oral   Take 25 mg by mouth daily.         . potassium chloride (K-DUR) 10 MEQ tablet   Oral   Take 10 mEq by mouth daily as needed (when taking Furosemide).          omeprazole  BP 130/77  Pulse 93  Temp(Src) 98.4 F (36.9 C) (Oral)  Resp  21  SpO2 96%  Vital signs normal    Physical Exam  Nursing note and vitals reviewed. Constitutional: She is oriented to person, place, and time. She appears well-developed and well-nourished.  Non-toxic appearance. She does not appear ill. No distress.  HENT:  Head: Normocephalic and atraumatic.  Right Ear: External ear normal.  Left Ear: External ear normal.  Nose: Nose normal. No mucosal edema or rhinorrhea.  Mouth/Throat: Oropharynx is clear and moist and mucous membranes are normal. No dental abscesses or edematous.  No trauma to tongue  Eyes: Conjunctivae and EOM are normal. Pupils are equal, round, and reactive to light.  Neck: Normal range of motion and full passive range of motion without pain. Neck supple.  Cardiovascular: Normal rate, regular rhythm and normal heart sounds.  Exam reveals no gallop and no friction rub.   No murmur heard. Pulmonary/Chest: Effort normal and breath sounds normal. No respiratory distress. She  has no wheezes. She has no rhonchi. She has no rales. She exhibits no tenderness and no crepitus.  Abdominal: Soft. Normal appearance and bowel sounds are normal. She exhibits no distension. There is no tenderness. There is no rebound and no guarding.  Musculoskeletal: Normal range of motion. She exhibits no edema and no tenderness.  Moves all extremities well.   Neurological: She is alert and oriented to person, place, and time. She has normal strength. No cranial nerve deficit.  Skin: Skin is warm, dry and intact. No rash noted. No erythema. No pallor.  Psychiatric: Her speech is normal and behavior is normal. Her mood appears not anxious.  Flat affect    ED Course  Procedures (including critical care time)  PT has had no further seizure activity while in the ED.   16:10 Dr Thad Ranger, neurology will consult.   18:00 Dr Thad Ranger, has seen patient, wants MR to be done and to have outpatient EEG, would not start medications unless her MR or EEG is abnormal. Also outpatient neurology referral.   20:15 MR Brain pending at change of shift, NP Sheilah Mins will get results and disposition patient.    Echocardiogram 03/20/2013 Study Conclusions  - Left ventricle: The cavity size was normal. Wall thickness was normal. Systolic function was vigorous. The estimated ejection fraction was in the range of 65% to 70%. Wall motion was normal; there were no regional wall motion abnormalities. Left ventricular diastolic function parameters were normal. - Aortic valve: Probablybicuspid valve. Mildly calcified leaflets with malcoaptaion. Mild to moderate stenosis (peak and mean gradients of 19 and 11 mmHg, calculated AVA of 1.4 cm2). Moderate to severe central regurgitation. Valve area: 1.42cm^2(VTI). Valve area: 1.3cm^2 (Vmax). - Mitral valve: Mildly thickened leaflets . Trivial regurgitation. - Left atrium: LA Volume/ BSA = 15.56ml/m2 The atrium was normal in size. - Inferior vena cava: Diameter:  14mm. Impressions:  - Compared to a prior study in 2013, there has been mild dilation of the ventricle from 4.4 to 4.8 cm (LVIDd). EF remains >55%. 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     Results for orders placed during the hospital encounter of 03/28/13  CBC WITH DIFFERENTIAL      Result Value Range   WBC 15.3 (*) 4.0 - 10.5 K/uL   RBC 4.11  3.87 - 5.11 MIL/uL   Hemoglobin 14.5  12.0 - 15.0 g/dL   HCT 16.1  09.6 - 04.5 %   MCV 100.2 (*) 78.0 - 100.0 fL   MCH 35.3 (*)  26.0 - 34.0 pg   MCHC 35.2  30.0 - 36.0 g/dL   RDW 16.1  09.6 - 04.5 %   Platelets 257  150 - 400 K/uL   Neutrophils Relative % 88 (*) 43 - 77 %   Neutro Abs 13.5 (*) 1.7 - 7.7 K/uL   Lymphocytes Relative 7 (*) 12 - 46 %   Lymphs Abs 1.1  0.7 - 4.0 K/uL   Monocytes Relative 3  3 - 12 %   Monocytes Absolute 0.5  0.1 - 1.0 K/uL   Eosinophils Relative 1  0 - 5 %   Eosinophils Absolute 0.2  0.0 - 0.7 K/uL   Basophils Relative 0  0 - 1 %   Basophils Absolute 0.0  0.0 - 0.1 K/uL  COMPREHENSIVE METABOLIC PANEL      Result Value Range   Sodium 135  135 - 145 mEq/L   Potassium 4.1  3.5 - 5.1 mEq/L   Chloride 103  96 - 112 mEq/L   CO2 20  19 - 32 mEq/L   Glucose, Bld 113 (*) 70 - 99 mg/dL   BUN 6  6 - 23 mg/dL   Creatinine, Ser 4.09  0.50 - 1.10 mg/dL   Calcium 9.2  8.4 - 81.1 mg/dL   Total Protein 6.7  6.0 - 8.3 g/dL   Albumin 3.6  3.5 - 5.2 g/dL   AST 14  0 - 37 U/L   ALT 11  0 - 35 U/L   Alkaline Phosphatase 58  39 - 117 U/L   Total Bilirubin 0.3  0.3 - 1.2 mg/dL   GFR calc non Af Amer >90  >90 mL/min   GFR calc Af Amer >90  >90 mL/min  URINALYSIS, ROUTINE W REFLEX MICROSCOPIC      Result Value Range   Color, Urine YELLOW  YELLOW   APPearance HAZY (*) CLEAR   Specific Gravity, Urine 1.018  1.005 - 1.030   pH 5.0  5.0 - 8.0   Glucose, UA NEGATIVE  NEGATIVE mg/dL   Hgb urine dipstick NEGATIVE  NEGATIVE   Bilirubin Urine NEGATIVE  NEGATIVE    Ketones, ur NEGATIVE  NEGATIVE mg/dL   Protein, ur 30 (*) NEGATIVE mg/dL   Urobilinogen, UA 0.2  0.0 - 1.0 mg/dL   Nitrite NEGATIVE  NEGATIVE   Leukocytes, UA NEGATIVE  NEGATIVE  PREGNANCY, URINE      Result Value Range   Preg Test, Ur NEGATIVE  NEGATIVE  URINE RAPID DRUG SCREEN (HOSP PERFORMED)      Result Value Range   Opiates NONE DETECTED  NONE DETECTED   Cocaine NONE DETECTED  NONE DETECTED   Benzodiazepines NONE DETECTED  NONE DETECTED   Amphetamines NONE DETECTED  NONE DETECTED   Tetrahydrocannabinol POSITIVE (*) NONE DETECTED   Barbiturates NONE DETECTED  NONE DETECTED  URINE MICROSCOPIC-ADD ON      Result Value Range   Squamous Epithelial / LPF RARE  RARE   WBC, UA 0-2  <3 WBC/hpf   RBC / HPF 0-2  <3 RBC/hpf   Casts HYALINE CASTS (*) NEGATIVE   Laboratory interpretation all normal except leukocytosis     Date: 03/28/2013  Rate: 86  Rhythm: normal sinus rhythm  QRS Axis: normal  Intervals: normal  ST/T Wave abnormalities: normal  Conduction Disutrbances:none  Narrative Interpretation:   Old EKG Reviewed: unchanged from 11/21/2012    1. Seizure    Disposition pending   Devoria Albe, MD, FACEP    MDM patient had  her second seizure in the last 2 years. She's been evaluated by neurology. Neurologist does not feel she needs medications unless her MR is abnormal or her EEG is abnormal. Patient is to followup with a  neurologist at [facility] neurology. She will need to call to schedule an appointment. She should return to the ED if she has a repeat seizure in the next few weeks or months at which point she may need to be started on seizure medication.           Ward Givens, MD 03/28/13 2017  Ward Givens, MD 03/28/13 2020

## 2013-03-28 NOTE — ED Notes (Signed)
Pt reports to the ED for eval of witnessed seizure. Per family members pt had seizure for 5-10 minutes. Pt was lying in bed when the seizure occurred. Pt was found to be in a postictal state. No incontinence or oral trauma. Pt A&O x4 at this time. Pt was disoriented and combative en route. No neuro deficits noted at this time. Pt has hx of seizures. No head injury occurred. Pt denies possibility of pregnancy. Pt reports she has never been placed on medication for her seizure disorder. Pt tearful at this time. CBG was 104 en route.

## 2013-03-28 NOTE — Consult Note (Signed)
Reason for Consult:Seizure Referring Physician: Lynelle Doctor  CC: Seizure  HPI: Kaitlin Wong is an 32 y.o. female who was with friends this morning and noted to have a generalized seizure.  Patient is amnestic of the event.  Has had a previous seizure about 2 years ago.  At that time the seizure was attributed to sleep deprivation.  The patient reports that due to school work she has again been sleep deprived and in fact did not sleep at all last night.    Past Medical History  Diagnosis Date  . Endocarditis   . Hypertension   . RLS (restless legs syndrome)   . Aortic valve disease   . History of seizures   . Leg pain, bilateral     L>R  . Leg swelling     L>R  . Seizures     Past Surgical History  Procedure Laterality Date  . Oophrectomy      left  . Cesearean section    . Tubal ligation    . Cystoscopy/retrograde/ureteroscopy/stone extraction with basket      per pt.  cysts removed from both legs, vagina, and chest    Family history:  Mother alive and well with only back problems.  Father alive and well with no medical issues.  Brother died in a drowning.   Social History:  reports that she has been smoking.  She does not have any smokeless tobacco history on file. She reports that  drinks alcohol. She reports that she uses illicit drugs (Marijuana) about once per week.  Allergies  Allergen Reactions  . Morphine And Related Itching    Medications: I have reviewed the patient's current medications. Prior to Admission:  Current outpatient prescriptions:furosemide (LASIX) 40 MG tablet, Take 40 mg by mouth daily as needed (for leg swelling). , Disp: , Rfl: ;  gabapentin (NEURONTIN) 300 MG capsule, Take 300 mg by mouth 3 (three) times daily., Disp: , Rfl: ;  losartan (COZAAR) 25 MG tablet, Take 25 mg by mouth daily., Disp: , Rfl: ;  potassium chloride (K-DUR) 10 MEQ tablet, Take 10 mEq by mouth daily as needed (when taking Furosemide)., Disp: , Rfl:   ROS: History obtained from  mother  General ROS: negative for - chills, fatigue, fever, night sweats, weight gain or weight loss Psychological ROS: negative for - behavioral disorder, hallucinations, memory difficulties, mood swings or suicidal ideation Ophthalmic ROS: negative for - blurry vision, double vision, eye pain or loss of vision ENT ROS: negative for - epistaxis, nasal discharge, oral lesions, sore throat, tinnitus or vertigo Allergy and Immunology ROS: negative for - hives or itchy/watery eyes Hematological and Lymphatic ROS: negative for - bleeding problems, bruising or swollen lymph nodes Endocrine ROS: negative for - galactorrhea, hair pattern changes, polydipsia/polyuria or temperature intolerance Respiratory ROS: negative for - cough, hemoptysis, shortness of breath or wheezing Cardiovascular ROS: leg edema Gastrointestinal ROS: negative for - abdominal pain, diarrhea, hematemesis, nausea/vomiting or stool incontinence Genito-Urinary ROS: negative for - dysuria, hematuria, incontinence or urinary frequency/urgency Musculoskeletal ROS: negative for - joint swelling or muscular weakness Neurological ROS: as noted in HPI, headache Dermatological ROS: negative for rash and skin lesion changes  Physical Examination: Blood pressure 102/58, pulse 86, temperature 98.4 F (36.9 C), temperature source Oral, resp. rate 24, SpO2 96.00%.  Neurologic Examination Mental Status: Alert, oriented, thought content appropriate.  Speech fluent without evidence of aphasia.  Able to follow 3 step commands without difficulty. Cranial Nerves: II: Discs flat bilaterally; Visual fields grossly  normal, pupils equal, round, reactive to light and accommodation III,IV, VI: ptosis not present, extra-ocular motions intact bilaterally V,VII: smile symmetric, facial light touch sensation normal bilaterally VIII: hearing normal bilaterally IX,X: gag reflex present XI: bilateral shoulder shrug XII: midline tongue  extension Motor: Right : Upper extremity   5/5    Left:     Upper extremity   5/5  Lower extremity   5/5     Lower extremity   5/5 Tone and bulk:normal tone throughout; no atrophy noted Sensory: Pinprick and light touch intact throughout, bilaterally Deep Tendon Reflexes: 2+ and symmetric with absent AJ's bilaterally Plantars: Right: downgoing   Left: downgoing Cerebellar: normal finger-to-nose and normal heel-to-shin test Gait: Unable to test CV: pulses palpable throughout   Laboratory Studies:   Basic Metabolic Panel:  Recent Labs Lab 03/28/13 1535  NA 135  K 4.1  CL 103  CO2 20  GLUCOSE 113*  BUN 6  CREATININE 0.65  CALCIUM 9.2    Liver Function Tests:  Recent Labs Lab 03/28/13 1535  AST 14  ALT 11  ALKPHOS 58  BILITOT 0.3  PROT 6.7  ALBUMIN 3.6   No results found for this basename: LIPASE, AMYLASE,  in the last 168 hours No results found for this basename: AMMONIA,  in the last 168 hours  CBC:  Recent Labs Lab 03/28/13 1535  WBC 15.3*  NEUTROABS 13.5*  HGB 14.5  HCT 41.2  MCV 100.2*  PLT 257    Cardiac Enzymes: No results found for this basename: CKTOTAL, CKMB, CKMBINDEX, TROPONINI,  in the last 168 hours  BNP: No components found with this basename: POCBNP,   CBG: No results found for this basename: GLUCAP,  in the last 168 hours  Microbiology: Results for orders placed during the hospital encounter of 10/24/09  WOUND CULTURE     Status: None   Collection Time    10/24/09  6:27 PM      Result Value Range Status   Specimen Description LABIA   Final   Special Requests NONE   Final   Gram Stain     Final   Value: RARE WBC PRESENT, PREDOMINANTLY PMN     RARE SQUAMOUS EPITHELIAL CELLS PRESENT     RARE GRAM POSITIVE COCCI IN PAIRS   Culture     Final   Value: MULTIPLE ORGANISMS PRESENT, NONE PREDOMINANT NO STAPHYLOCOCCUS AUREUS ISOLATED NO GROUP A STREP (S.PYOGENES) ISOLATED NO GROUP B STREP (S.AGALACTIAE) ISOLATED   Report Status  10/27/2009 FINAL   Final    Coagulation Studies: No results found for this basename: LABPROT, INR,  in the last 72 hours  Urinalysis:  Recent Labs Lab 03/28/13 1529  COLORURINE YELLOW  LABSPEC 1.018  PHURINE 5.0  GLUCOSEU NEGATIVE  HGBUR NEGATIVE  BILIRUBINUR NEGATIVE  KETONESUR NEGATIVE  PROTEINUR 30*  UROBILINOGEN 0.2  NITRITE NEGATIVE  LEUKOCYTESUR NEGATIVE    Lipid Panel:  No results found for this basename: chol, trig, hdl, cholhdl, vldl, ldlcalc    HgbA1C:  No results found for this basename: HGBA1C    Urine Drug Screen:     Component Value Date/Time   LABOPIA NONE DETECTED 03/28/2013 1529   COCAINSCRNUR NONE DETECTED 03/28/2013 1529   LABBENZ NONE DETECTED 03/28/2013 1529   AMPHETMU NONE DETECTED 03/28/2013 1529   THCU POSITIVE* 03/28/2013 1529   LABBARB NONE DETECTED 03/28/2013 1529    Alcohol Level: No results found for this basename: ETH,  in the last 168 hours  Imaging: No results found.  Assessment/Plan: 32 year old female with recurrent seizure.  Last seizure 2 years ago.  Has not been on anticonvulsant medication.  Felt to be secondary to sleep deprivation.  Has not had a EEG or MRI in the past.  Other than headache at baseline otherwise.    Recommendations: 1.  MRI of the brain with and without contrast 2.  EEG-may be performed as an outpatient 3.  Would not start anticonvulsants at this time.  Patient advised against sleep deprivation.  Patient unable to drive, operate heavy machinery, perform activities at heights and participate in water activities until release by outpatient physician. 4.  Patient to follow up with neurology on an outpatient basis.  May be discharged from the ED if MRI unremarkable.    Thana Farr, MD [facility] [phone] 03/28/2013, 5:49 PM

## 2013-03-29 NOTE — ED Provider Notes (Signed)
Medical screening examination/treatment/procedure(s) were conducted as a shared visit with non-physician practitioner(s) and myself.  I personally evaluated the patient during the encounter Devoria Albe, MD, Franz Dell, MD 03/29/13 1058

## 2013-04-08 ENCOUNTER — Encounter: Payer: Self-pay | Admitting: *Deleted

## 2013-04-11 ENCOUNTER — Encounter: Payer: Self-pay | Admitting: Cardiology

## 2013-04-11 ENCOUNTER — Ambulatory Visit (INDEPENDENT_AMBULATORY_CARE_PROVIDER_SITE_OTHER): Payer: Medicaid Other | Admitting: Cardiology

## 2013-04-11 VITALS — BP 102/72 | HR 68 | Ht 60.0 in | Wt 100.5 lb

## 2013-04-11 DIAGNOSIS — R0602 Shortness of breath: Secondary | ICD-10-CM

## 2013-04-11 DIAGNOSIS — M7989 Other specified soft tissue disorders: Secondary | ICD-10-CM

## 2013-04-11 DIAGNOSIS — I351 Nonrheumatic aortic (valve) insufficiency: Secondary | ICD-10-CM

## 2013-04-11 DIAGNOSIS — F17200 Nicotine dependence, unspecified, uncomplicated: Secondary | ICD-10-CM

## 2013-04-11 DIAGNOSIS — I359 Nonrheumatic aortic valve disorder, unspecified: Secondary | ICD-10-CM

## 2013-04-11 DIAGNOSIS — Z720 Tobacco use: Secondary | ICD-10-CM

## 2013-04-11 DIAGNOSIS — I10 Essential (primary) hypertension: Secondary | ICD-10-CM

## 2013-04-11 DIAGNOSIS — R071 Chest pain on breathing: Secondary | ICD-10-CM

## 2013-04-11 DIAGNOSIS — G8929 Other chronic pain: Secondary | ICD-10-CM

## 2013-04-11 NOTE — Patient Instructions (Addendum)
Your physician wants you to follow-up in 3 months  You will receive a reminder letter in the mail two months in advance. If you don't receive a letter, please call our office to schedule the follow-up appointment.  You will be schedule for CPET/PFT here at the office . It is usually schedule on Mondays

## 2013-04-14 ENCOUNTER — Encounter: Payer: Self-pay | Admitting: Cardiology

## 2013-04-14 DIAGNOSIS — R0602 Shortness of breath: Secondary | ICD-10-CM | POA: Insufficient documentation

## 2013-04-14 DIAGNOSIS — G8929 Other chronic pain: Secondary | ICD-10-CM | POA: Insufficient documentation

## 2013-04-14 DIAGNOSIS — I351 Nonrheumatic aortic (valve) insufficiency: Secondary | ICD-10-CM | POA: Insufficient documentation

## 2013-04-14 DIAGNOSIS — Z720 Tobacco use: Secondary | ICD-10-CM | POA: Insufficient documentation

## 2013-04-14 NOTE — Assessment & Plan Note (Signed)
Evaluate with CPET/MET tests and follow-up after.an extremely low oxygen consumption would potentially push for earlier referral for surgery as opposed to later.

## 2013-04-14 NOTE — Assessment & Plan Note (Signed)
Probably related to her lymphadenopathy she had before. However some of the high to explain it. She continues to be on Lasix andcontinues to be monitored by myself and her primary.

## 2013-04-14 NOTE — Assessment & Plan Note (Addendum)
If anything she is a bit hypotensive today on the losartan.we do not have any room for beta blocker which would also be helpful in this situation.

## 2013-04-14 NOTE — Assessment & Plan Note (Signed)
Slight worsening of the aortic valve effect with mild left ventricular dilatation. This is not above the threshold. We'll need to follow-up with echo in 6 months two-year depending on her symptoms. I am reluctant to send someone so young for valve surgery, and I am not sure that her symptoms are truly attributable to her valve disease. The left ventricle size and functiondoes seem to be relatively well preserved.

## 2013-04-14 NOTE — Progress Notes (Signed)
Patient ID: Kaitlin Wong, female   DOB: Jul 09, 1981, 32 y.o.   MRN: [id number]  Clinic Note: HPI: Kaitlin Wong is a 32 y.o. female with a PMH Of well treated hypertension and moderate to severe aortic regurgitation, who presents today for follow-up to discuss her recent echocardiogram..  Interval History: Kaitlin Wong still notes leg heaviness and shortness of breath with walking PND type symptoms that she describes intermittently as well as orthopnea symptoms. She always seems lethargic and fatigued when I see her in the office. She denies any exertional chest pain or or or resting chest pain besides having intermittent mild fleeting episodes of chest discomfort. She denies any palpitations, and lightheadedness and dizziness. No syncope or near-syncope, no TIA or amaurosis fugax symptoms.she does a history of seizure disorder. Pelvic has had a seizure recently. These are very infrequent however. She does continue to have her edema but it seems relatively well controlled her Lasix. Overall she seems to be a little more depressed today more complaining of her shortness of breath other symptoms. The remainder of his Cardiovascular ROS: positive for - dyspnea on exertion, edema, orthopnea and paroxysmal nocturnal dyspnea negative for - chest pain, loss of consciousness, palpitations, rapid heart rate or shortness of breath is as follows:  Past Medical History  Diagnosis Date  . Endocarditis     distant history of this. Cannot be confirmed.  Marland Kitchen RLS (restless legs syndrome)   . Moderate to severe aortic regurgitation 2011    echocardiogram October 15 13: Possible bicuspid aortic valve, moderate to severe regurgitation > 2011; Echo 03/2013: EF 65-70%. Normal wall motion. Mild to moderate aortic stenosis with moderate to severe rotation. Probable bicuspid valve noted. Mild dilation of the left ventricle from 4.4-4.8 cm LV IDd  . History of seizures   . Leg pain, bilateral     L>R  . Leg swelling    L>R; have a history of likely lymphedema from cyst removal  . Seizures   . Hypertension 08/15/12    ECHO-EF> 55% A Bicupsid aortic valve cannot be excluded. When compared to 2012 the aortic insuffiency appears more severe. 01/12/12 GXT negative Bruce Treadmill.test  . Chronic chest wall pain     evaluated with treadmill stress test March oh 13: Exercise 11 minutes reaching 34 for medical problems. No EKG changes.   Prior cardiac evaluation and past surgical history: Past Surgical History  Procedure Laterality Date  . Oophrectomy      left  . Cesearean section    . Tubal ligation    . Cystoscopy/retrograde/ureteroscopy/stone extraction with basket      per pt.  cysts removed from both legs, vagina, and chest    Allergies  Allergen Reactions  . Lisinopril Cough  . Morphine And Related Itching  . Other     Patient reports being allergic to anesthesia.    Current Outpatient Prescriptions  Medication Sig Dispense Refill  . furosemide (LASIX) 40 MG tablet Take 40 mg by mouth daily as needed (for leg swelling).       . gabapentin (NEURONTIN) 300 MG capsule Take 300 mg by mouth 3 (three) times daily.      Marland Kitchen losartan (COZAAR) 25 MG tablet Take 25 mg by mouth daily.      Marland Kitchen oxyCODONE (OXY IR/ROXICODONE) 5 MG immediate release tablet Take 5 mg by mouth every 8 (eight) hours as needed for pain.      . potassium chloride (K-DUR) 10 MEQ tablet Take 10 mEq by mouth  daily as needed (when taking Furosemide).       No current facility-administered medications for this visit.    History   Social History  . Marital Status: Single    Spouse Name: N/A    Number of Children: 1  . Years of Education: N/A   Occupational History  . Not on file.   Social History Main Topics  . Smoking status: Current Every Day Smoker -- 0.75 packs/day    Types: Cigarettes  . Smokeless tobacco: Not on file  . Alcohol Use: Yes     Comment: occasionally  . Drug Use: 1.00 per week    Special: Marijuana  .  Sexually Active: Not on file   Other Topics Concern  . Not on file   Social History Narrative   She is a single mother who is accompanied by her mother. She still smokes about half to third pack a day. She does get routine exercise but she is has been relatively active.    ROS: A comprehensive Review of Systems - Negative except pertinent positives noted above.she also does seem to endorse some anhedonia and other symptoms consistent with possible mild depression.  PHYSICAL EXAM BP 102/72  Pulse 68  Ht 5' (1.524 m)  Wt 100 lb 8 oz (45.587 kg)  BMI 19.63 kg/m2  LMP 03/26/2013 General appearance: alert, cooperative, appears stated age, no distress and lethargic, an energetic mood and affect Kaitlin Wong, she was less tonsillar. Neck: no carotid bruit, no JVD and supple, symmetrical, trachea midline Lungs: clear to auscultation bilaterally, normal percussion bilaterally and nonlabored with no W./R./R. Heart: regular rate and rhythm, S1, S2 normal, systolic murmur: systolic ejection 1/6, crescendo, decrescendo and harsh at 2nd right intercostal space, radiates to axilla and diastolic murmur: holodiastolic 3/6, blowing and cooing at 2nd left intercostal space, at 2nd right intercostal space, throughout the precordium Abdomen: soft, non-tender; bowel sounds normal; no masses,  no organomegaly and no HJR Extremities: edema trace edema, with no venous stasis changes Pulses: 2+ and symmetric Neurologic: Mental status: Alert, oriented, thought content appropriate, affect: normal and somewhat dejected Cranial nerves: normal  BJY:NWGNFAOZH today: No  ASSESSMENT: Mild progression of the aortic regurgitation with slightly increased left ventricular size. This does not quite yet meet the criteria for considering referral for surgery. Again he began follows annually if not more frequently based on her symptoms.  With her continued dyspnea on exertion, that is out of proportion with the extent of her valve  disease and left ventricular effect of, I think the best to evaluate this is with a cardio pulmonary function tests (CPET-MET).   SOB (shortness of breath) on exertion - Plan: CPET WITH PFT (MET TEST)  Moderate to severe aortic regurgitation  Hypertension  Chronic chest wall pain  Exertional shortness of breath  Tobacco abuse  PLAN: Per problem list. Orders Placed This Encounter  Procedures  . CPET WITH PFT (MET TEST)    Standing Status: Future     Number of Occurrences:      Standing Expiration Date: 04/11/2014    Followup: 3 months  HARDING,DAVID W, M.D., M.S. [facility] [address] [place], [place]  [address]  [phone] Pager # [phone] 04/14/2013 12:22 AM

## 2013-05-01 ENCOUNTER — Encounter (INDEPENDENT_AMBULATORY_CARE_PROVIDER_SITE_OTHER): Payer: Medicaid Other

## 2013-05-01 DIAGNOSIS — R0602 Shortness of breath: Secondary | ICD-10-CM

## 2013-05-05 ENCOUNTER — Emergency Department (HOSPITAL_COMMUNITY): Payer: Medicaid Other

## 2013-05-05 ENCOUNTER — Inpatient Hospital Stay (HOSPITAL_COMMUNITY)
Admission: EM | Admit: 2013-05-05 | Discharge: 2013-06-09 | DRG: 082 | Disposition: E | Payer: Medicaid Other | Attending: General Surgery | Admitting: General Surgery

## 2013-05-05 ENCOUNTER — Encounter (HOSPITAL_COMMUNITY): Payer: Self-pay | Admitting: General Surgery

## 2013-05-05 DIAGNOSIS — J95821 Acute postprocedural respiratory failure: Secondary | ICD-10-CM | POA: Diagnosis present

## 2013-05-05 DIAGNOSIS — F17200 Nicotine dependence, unspecified, uncomplicated: Secondary | ICD-10-CM | POA: Diagnosis present

## 2013-05-05 DIAGNOSIS — S41109A Unspecified open wound of unspecified upper arm, initial encounter: Secondary | ICD-10-CM | POA: Diagnosis present

## 2013-05-05 DIAGNOSIS — M84421A Pathological fracture, right humerus, initial encounter for fracture: Secondary | ICD-10-CM

## 2013-05-05 DIAGNOSIS — S42109A Fracture of unspecified part of scapula, unspecified shoulder, initial encounter for closed fracture: Secondary | ICD-10-CM | POA: Diagnosis present

## 2013-05-05 DIAGNOSIS — Z87898 Personal history of other specified conditions: Secondary | ICD-10-CM

## 2013-05-05 DIAGNOSIS — S0292XA Unspecified fracture of facial bones, initial encounter for closed fracture: Secondary | ICD-10-CM

## 2013-05-05 DIAGNOSIS — F10220 Alcohol dependence with intoxication, uncomplicated: Secondary | ICD-10-CM

## 2013-05-05 DIAGNOSIS — S01409A Unspecified open wound of unspecified cheek and temporomandibular area, initial encounter: Secondary | ICD-10-CM | POA: Diagnosis present

## 2013-05-05 DIAGNOSIS — IMO0002 Reserved for concepts with insufficient information to code with codable children: Secondary | ICD-10-CM

## 2013-05-05 DIAGNOSIS — S42101A Fracture of unspecified part of scapula, right shoulder, initial encounter for closed fracture: Secondary | ICD-10-CM

## 2013-05-05 DIAGNOSIS — N390 Urinary tract infection, site not specified: Secondary | ICD-10-CM | POA: Diagnosis not present

## 2013-05-05 DIAGNOSIS — Y998 Other external cause status: Secondary | ICD-10-CM

## 2013-05-05 DIAGNOSIS — R4020 Unspecified coma: Principal | ICD-10-CM | POA: Diagnosis present

## 2013-05-05 DIAGNOSIS — G40909 Epilepsy, unspecified, not intractable, without status epilepticus: Secondary | ICD-10-CM | POA: Diagnosis present

## 2013-05-05 DIAGNOSIS — K3189 Other diseases of stomach and duodenum: Secondary | ICD-10-CM | POA: Diagnosis present

## 2013-05-05 DIAGNOSIS — S41111A Laceration without foreign body of right upper arm, initial encounter: Secondary | ICD-10-CM | POA: Diagnosis present

## 2013-05-05 DIAGNOSIS — R403 Persistent vegetative state: Secondary | ICD-10-CM | POA: Diagnosis present

## 2013-05-05 DIAGNOSIS — Z66 Do not resuscitate: Secondary | ICD-10-CM | POA: Diagnosis not present

## 2013-05-05 DIAGNOSIS — S81811A Laceration without foreign body, right lower leg, initial encounter: Secondary | ICD-10-CM

## 2013-05-05 DIAGNOSIS — S0081XA Abrasion of other part of head, initial encounter: Secondary | ICD-10-CM

## 2013-05-05 DIAGNOSIS — S42209A Unspecified fracture of upper end of unspecified humerus, initial encounter for closed fracture: Secondary | ICD-10-CM | POA: Diagnosis present

## 2013-05-05 DIAGNOSIS — J13 Pneumonia due to Streptococcus pneumoniae: Secondary | ICD-10-CM | POA: Diagnosis not present

## 2013-05-05 DIAGNOSIS — M899 Disorder of bone, unspecified: Secondary | ICD-10-CM | POA: Diagnosis present

## 2013-05-05 DIAGNOSIS — S0280XA Fracture of other specified skull and facial bones, unspecified side, initial encounter for closed fracture: Secondary | ICD-10-CM | POA: Diagnosis present

## 2013-05-05 DIAGNOSIS — A498 Other bacterial infections of unspecified site: Secondary | ICD-10-CM | POA: Diagnosis not present

## 2013-05-05 DIAGNOSIS — J9600 Acute respiratory failure, unspecified whether with hypoxia or hypercapnia: Secondary | ICD-10-CM

## 2013-05-05 DIAGNOSIS — S71009A Unspecified open wound, unspecified hip, initial encounter: Secondary | ICD-10-CM | POA: Diagnosis present

## 2013-05-05 DIAGNOSIS — Z515 Encounter for palliative care: Secondary | ICD-10-CM

## 2013-05-05 DIAGNOSIS — S058X9A Other injuries of unspecified eye and orbit, initial encounter: Secondary | ICD-10-CM | POA: Diagnosis present

## 2013-05-05 DIAGNOSIS — D62 Acute posthemorrhagic anemia: Secondary | ICD-10-CM | POA: Diagnosis present

## 2013-05-05 DIAGNOSIS — Y92410 Unspecified street and highway as the place of occurrence of the external cause: Secondary | ICD-10-CM

## 2013-05-05 DIAGNOSIS — S0219XA Other fracture of base of skull, initial encounter for closed fracture: Secondary | ICD-10-CM | POA: Diagnosis present

## 2013-05-05 DIAGNOSIS — S066X9A Traumatic subarachnoid hemorrhage with loss of consciousness of unspecified duration, initial encounter: Secondary | ICD-10-CM

## 2013-05-05 DIAGNOSIS — S71109A Unspecified open wound, unspecified thigh, initial encounter: Secondary | ICD-10-CM | POA: Diagnosis present

## 2013-05-05 DIAGNOSIS — H1130 Conjunctival hemorrhage, unspecified eye: Secondary | ICD-10-CM | POA: Diagnosis present

## 2013-05-05 HISTORY — DX: Shortness of breath: R06.02

## 2013-05-05 HISTORY — DX: Headache: R51

## 2013-05-05 HISTORY — DX: Depression, unspecified: F32.A

## 2013-05-05 HISTORY — DX: Gastro-esophageal reflux disease without esophagitis: K21.9

## 2013-05-05 HISTORY — DX: Major depressive disorder, single episode, unspecified: F32.9

## 2013-05-05 HISTORY — DX: Cardiac murmur, unspecified: R01.1

## 2013-05-05 HISTORY — DX: Nausea with vomiting, unspecified: R11.2

## 2013-05-05 HISTORY — DX: Other specified postprocedural states: Z98.890

## 2013-05-05 HISTORY — DX: Chronic obstructive pulmonary disease, unspecified: J44.9

## 2013-05-05 LAB — URINE MICROSCOPIC-ADD ON

## 2013-05-05 LAB — COMPREHENSIVE METABOLIC PANEL WITH GFR
Albumin: 3.2 g/dL — ABNORMAL LOW (ref 3.5–5.2)
Alkaline Phosphatase: 53 U/L (ref 39–117)
BUN: 6 mg/dL (ref 6–23)
Chloride: 105 meq/L (ref 96–112)
Glucose, Bld: 178 mg/dL — ABNORMAL HIGH (ref 70–99)
Potassium: 3.6 meq/L (ref 3.5–5.1)
Total Bilirubin: 0.3 mg/dL (ref 0.3–1.2)

## 2013-05-05 LAB — CBC
HCT: 36.7 % (ref 36.0–46.0)
Hemoglobin: 12.8 g/dL (ref 12.0–15.0)
MCH: 35.2 pg — ABNORMAL HIGH (ref 26.0–34.0)
MCHC: 34.9 g/dL (ref 30.0–36.0)
MCV: 100.8 fL — ABNORMAL HIGH (ref 78.0–100.0)
Platelets: 192 K/uL (ref 150–400)
RBC: 3.64 MIL/uL — ABNORMAL LOW (ref 3.87–5.11)
RDW: 13.1 % (ref 11.5–15.5)
WBC: 15.2 K/uL — ABNORMAL HIGH (ref 4.0–10.5)

## 2013-05-05 LAB — URINALYSIS, ROUTINE W REFLEX MICROSCOPIC
Bilirubin Urine: NEGATIVE
Glucose, UA: NEGATIVE mg/dL
Ketones, ur: NEGATIVE mg/dL
Leukocytes, UA: NEGATIVE
Nitrite: NEGATIVE
Protein, ur: NEGATIVE mg/dL
Specific Gravity, Urine: 1.005 (ref 1.005–1.030)
Urobilinogen, UA: 0.2 mg/dL (ref 0.0–1.0)
pH: 6.5 (ref 5.0–8.0)

## 2013-05-05 LAB — RAPID URINE DRUG SCREEN, HOSP PERFORMED
Amphetamines: NOT DETECTED
Barbiturates: NOT DETECTED
Benzodiazepines: NOT DETECTED
Cocaine: NOT DETECTED
Opiates: NOT DETECTED
Tetrahydrocannabinol: POSITIVE — AB

## 2013-05-05 LAB — COMPREHENSIVE METABOLIC PANEL
ALT: 60 U/L — ABNORMAL HIGH (ref 0–35)
AST: 129 U/L — ABNORMAL HIGH (ref 0–37)
CO2: 22 mEq/L (ref 19–32)
Calcium: 8.5 mg/dL (ref 8.4–10.5)
Creatinine, Ser: 0.65 mg/dL (ref 0.50–1.10)
GFR calc Af Amer: >90 mL/min (ref >90)
GFR calc non Af Amer: >90 mL/min (ref >90)
Sodium: 140 mEq/L (ref 135–145)
Total Protein: 6.1 g/dL (ref 6.0–8.3)

## 2013-05-05 LAB — POCT I-STAT, CHEM 8
Calcium, Ion: 1.19 mmol/L (ref 1.12–1.23)
HCT: 40 % (ref 36.0–46.0)
Hemoglobin: 13.6 g/dL (ref 12.0–15.0)
Sodium: 141 mEq/L (ref 135–145)
TCO2: 22 mmol/L (ref 0–100)

## 2013-05-05 LAB — CG4 I-STAT (LACTIC ACID): Lactic Acid, Venous: 4.74 mmol/L — ABNORMAL HIGH (ref 0.5–2.2)

## 2013-05-05 LAB — PROTIME-INR
INR: 1.24 (ref 0.00–1.49)
Prothrombin Time: 15.3 seconds — ABNORMAL HIGH (ref 11.6–15.2)

## 2013-05-05 LAB — ETHANOL: Alcohol, Ethyl (B): 173 mg/dL — ABNORMAL HIGH (ref 0–11)

## 2013-05-05 MED ORDER — PROPOFOL 10 MG/ML IV EMUL: 5.0000 ug/kg/min | Freq: Once | INTRAVENOUS | Status: DC

## 2013-05-05 MED ORDER — SODIUM CHLORIDE 0.9 % IV SOLN
INTRAVENOUS | Status: AC | PRN
  Administered 2013-05-05 (×2): 1000 mL via INTRAVENOUS

## 2013-05-05 MED ORDER — FENTANYL CITRATE 0.05 MG/ML IJ SOLN
50.0000 ug | Freq: Once | INTRAMUSCULAR | Status: AC
  Administered 2013-05-06: 50 ug via INTRAVENOUS

## 2013-05-05 MED ORDER — SUCCINYLCHOLINE CHLORIDE 20 MG/ML IJ SOLN
INTRAMUSCULAR | Status: AC | PRN
Start: 2013-05-05 — End: 2013-05-05
  Administered 2013-05-05: 100 mg via INTRAVENOUS

## 2013-05-05 MED ORDER — TETANUS-DIPHTH-ACELL PERTUSSIS 5-2.5-18.5 LF-MCG/0.5 IM SUSP
0.5000 mL | Freq: Once | INTRAMUSCULAR | Status: AC
  Administered 2013-05-06: 0.5 mL via INTRAMUSCULAR
  Filled 2013-05-05: qty 0.5

## 2013-05-05 MED ORDER — ONDANSETRON HCL 4 MG/2ML IJ SOLN
INTRAMUSCULAR | Status: AC
  Administered 2013-05-05: 4 mg
  Filled 2013-05-05: qty 2

## 2013-05-05 MED ORDER — ETOMIDATE 2 MG/ML IV SOLN
INTRAVENOUS | Status: AC | PRN
  Administered 2013-05-05: 20 mg via INTRAVENOUS

## 2013-05-05 MED ORDER — IOHEXOL 300 MG/ML  SOLN
100.0000 mL | Freq: Once | INTRAMUSCULAR | Status: AC | PRN
  Administered 2013-05-05: 100 mL via INTRAVENOUS

## 2013-05-05 NOTE — ED Notes (Signed)
Dr. Thompson at bedside. 

## 2013-05-05 NOTE — ED Notes (Signed)
Dr Derrell Lolling in room with pt

## 2013-05-05 NOTE — ED Notes (Signed)
Pt transported to CT ?

## 2013-05-05 NOTE — ED Notes (Signed)
Family at bedside. 
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
## 2013-05-05 NOTE — ED Notes (Signed)
Pt transported to xray 

## 2013-05-05 NOTE — Progress Notes (Signed)
Orthopedic Tech Progress Note Patient Details:  Kaitlin Wong 05/31/1981 [id number]  Patient ID: Kaitlin Wong, female   DOB: 24-Jul-1981, 32 y.o.   MRN: [id number] Made level 1 trauma visit   Nikki Dom 04/22/2013, 10:27 PM
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
## 2013-05-06 ENCOUNTER — Inpatient Hospital Stay (HOSPITAL_COMMUNITY): Payer: Medicaid Other

## 2013-05-06 ENCOUNTER — Encounter (HOSPITAL_COMMUNITY): Payer: Self-pay | Admitting: General Surgery

## 2013-05-06 DIAGNOSIS — S0081XA Abrasion of other part of head, initial encounter: Secondary | ICD-10-CM

## 2013-05-06 DIAGNOSIS — T07XXXA Unspecified multiple injuries, initial encounter: Secondary | ICD-10-CM

## 2013-05-06 DIAGNOSIS — J95821 Acute postprocedural respiratory failure: Secondary | ICD-10-CM

## 2013-05-06 DIAGNOSIS — S066X9A Traumatic subarachnoid hemorrhage with loss of consciousness of unspecified duration, initial encounter: Secondary | ICD-10-CM

## 2013-05-06 DIAGNOSIS — S42109A Fracture of unspecified part of scapula, unspecified shoulder, initial encounter for closed fracture: Secondary | ICD-10-CM

## 2013-05-06 DIAGNOSIS — S81811A Laceration without foreign body, right lower leg, initial encounter: Secondary | ICD-10-CM

## 2013-05-06 DIAGNOSIS — S0219XA Other fracture of base of skull, initial encounter for closed fracture: Secondary | ICD-10-CM | POA: Diagnosis present

## 2013-05-06 DIAGNOSIS — S42101A Fracture of unspecified part of scapula, right shoulder, initial encounter for closed fracture: Secondary | ICD-10-CM

## 2013-05-06 DIAGNOSIS — S41111A Laceration without foreign body of right upper arm, initial encounter: Secondary | ICD-10-CM | POA: Diagnosis present

## 2013-05-06 DIAGNOSIS — Z87898 Personal history of other specified conditions: Secondary | ICD-10-CM

## 2013-05-06 DIAGNOSIS — S42209A Unspecified fracture of upper end of unspecified humerus, initial encounter for closed fracture: Secondary | ICD-10-CM

## 2013-05-06 LAB — BASIC METABOLIC PANEL
BUN: 6 mg/dL (ref 6–23)
Calcium: 6.8 mg/dL — ABNORMAL LOW (ref 8.4–10.5)
Creatinine, Ser: 0.68 mg/dL (ref 0.50–1.10)
GFR calc Af Amer: >90 mL/min (ref >90)
GFR calc non Af Amer: >90 mL/min (ref >90)
Glucose, Bld: 175 mg/dL — ABNORMAL HIGH (ref 70–99)
Potassium: 3.2 mEq/L — ABNORMAL LOW (ref 3.5–5.1)

## 2013-05-06 LAB — CBC
HCT: 27.9 % — ABNORMAL LOW (ref 36.0–46.0)
HCT: 25.9 % — ABNORMAL LOW (ref 36.0–46.0)
Hemoglobin: 9.6 g/dL — ABNORMAL LOW (ref 12.0–15.0)
MCH: 35 pg — ABNORMAL HIGH (ref 26.0–34.0)
MCH: 34.7 pg — ABNORMAL HIGH (ref 26.0–34.0)
MCHC: 34.4 g/dL (ref 30.0–36.0)
MCV: 101.8 fL — ABNORMAL HIGH (ref 78.0–100.0)
MCV: 100 fL (ref 78.0–100.0)
Platelets: 124 10*3/uL — ABNORMAL LOW (ref 150–400)
RBC: 2.59 MIL/uL — ABNORMAL LOW (ref 3.87–5.11)
RDW: 13.7 % (ref 11.5–15.5)
WBC: 15.1 10*3/uL — ABNORMAL HIGH (ref 4.0–10.5)

## 2013-05-06 LAB — TROPONIN I: Troponin I: <0.3 ng/mL (ref <0.30)

## 2013-05-06 LAB — POCT I-STAT 3, ART BLOOD GAS (G3+)
Acid-base deficit: 9 mmol/L — ABNORMAL HIGH (ref 0.0–2.0)
Bicarbonate: 16.9 meq/L — ABNORMAL LOW (ref 20.0–24.0)
O2 Saturation: 100 %
TCO2: 18 mmol/L (ref 0–100)
pCO2 arterial: 35.9 mmHg (ref 35.0–45.0)
pH, Arterial: 7.28 — ABNORMAL LOW (ref 7.350–7.450)
pO2, Arterial: 443 mmHg — ABNORMAL HIGH (ref 80.0–100.0)

## 2013-05-06 LAB — PROTIME-INR: INR: 1.53 — ABNORMAL HIGH (ref 0.00–1.49)

## 2013-05-06 LAB — CDS SEROLOGY

## 2013-05-06 MED ORDER — PANTOPRAZOLE SODIUM 40 MG IV SOLR
40.0000 mg | Freq: Two times a day (BID) | INTRAVENOUS | Status: DC
  Administered 2013-05-06 – 2013-05-11 (×13): 40 mg via INTRAVENOUS
  Filled 2013-05-06 (×15): qty 40

## 2013-05-06 MED ORDER — PROPOFOL 10 MG/ML IV EMUL
INTRAVENOUS | Status: AC
  Filled 2013-05-06: qty 100

## 2013-05-06 MED ORDER — SODIUM BICARBONATE 8.4 % IV SOLN
INTRAVENOUS | Status: AC
  Filled 2013-05-06: qty 50

## 2013-05-06 MED ORDER — CHLORHEXIDINE GLUCONATE 0.12 % MT SOLN
15.0000 mL | Freq: Two times a day (BID) | OROMUCOSAL | Status: DC
  Administered 2013-05-06 – 2013-05-17 (×23): 15 mL via OROMUCOSAL
  Filled 2013-05-06 (×22): qty 15

## 2013-05-06 MED ORDER — ALBUMIN HUMAN 5 % IV SOLN
12.5000 g | Freq: Once | INTRAVENOUS | Status: AC
  Administered 2013-05-06: 12.5 g via INTRAVENOUS
  Filled 2013-05-06: qty 250

## 2013-05-06 MED ORDER — SODIUM CHLORIDE 0.9 % IV BOLUS (SEPSIS)
1000.0000 mL | Freq: Once | INTRAVENOUS | Status: AC
  Administered 2013-05-06: 1000 mL via INTRAVENOUS

## 2013-05-06 MED ORDER — BIOTENE DRY MOUTH MT LIQD
15.0000 mL | Freq: Two times a day (BID) | OROMUCOSAL | Status: DC
  Administered 2013-05-06 – 2013-05-09 (×8): 15 mL via OROMUCOSAL

## 2013-05-06 MED ORDER — PANTOPRAZOLE SODIUM 40 MG PO TBEC: 40.0000 mg | DELAYED_RELEASE_TABLET | Freq: Two times a day (BID) | ORAL | Status: DC

## 2013-05-06 MED ORDER — SODIUM BICARBONATE 8.4 % IV SOLN
50.0000 meq | Freq: Once | INTRAVENOUS | Status: AC
  Administered 2013-05-06: 50 meq via INTRAVENOUS

## 2013-05-06 MED ORDER — BACITRACIN-NEOMYCIN-POLYMYXIN OINTMENT TUBE
TOPICAL_OINTMENT | Freq: Two times a day (BID) | CUTANEOUS | Status: DC
  Administered 2013-05-06: 10:00:00 via TOPICAL
  Administered 2013-05-06: 1 via TOPICAL
  Administered 2013-05-06 – 2013-05-16 (×21): via TOPICAL
  Filled 2013-05-06: qty 15

## 2013-05-06 MED ORDER — HYDROMORPHONE HCL PF 1 MG/ML IJ SOLN
1.0000 mg | INTRAMUSCULAR | Status: DC | PRN
  Administered 2013-05-16 – 2013-05-17 (×3): 1 mg via INTRAVENOUS
  Filled 2013-05-06 (×3): qty 1

## 2013-05-06 MED ORDER — FENTANYL CITRATE 0.05 MG/ML IJ SOLN
INTRAMUSCULAR | Status: AC
  Filled 2013-05-06: qty 2

## 2013-05-06 MED ORDER — CEFAZOLIN SODIUM-DEXTROSE 2-3 GM-% IV SOLR
2.0000 g | Freq: Three times a day (TID) | INTRAVENOUS | Status: AC
  Administered 2013-05-06 – 2013-05-07 (×6): 2 g via INTRAVENOUS
  Filled 2013-05-06 (×6): qty 50

## 2013-05-06 MED ORDER — POTASSIUM CHLORIDE IN NACL 20-0.9 MEQ/L-% IV SOLN
INTRAVENOUS | Status: DC
  Administered 2013-05-06 – 2013-05-08 (×5): via INTRAVENOUS
  Administered 2013-05-08: 100 mL via INTRAVENOUS
  Administered 2013-05-10: 21:00:00 via INTRAVENOUS
  Administered 2013-05-10: 1000 mL via INTRAVENOUS
  Administered 2013-05-12 – 2013-05-16 (×4): via INTRAVENOUS
  Filled 2013-05-06 (×24): qty 1000

## 2013-05-06 MED ORDER — HEPARIN SODIUM (PORCINE) 5000 UNIT/ML IJ SOLN
5000.0000 [IU] | Freq: Three times a day (TID) | INTRAMUSCULAR | Status: DC
  Filled 2013-05-06 (×4): qty 1

## 2013-05-06 NOTE — Progress Notes (Signed)
Patient hypotensive and bradycardic. Dr. Janee Morn paged and made aware. 1 L bolus of NS ordered.  Will continue to monitor. Jacqulynn Cadet
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
## 2013-05-06 NOTE — ED Notes (Signed)
Opthalmology at bedside.

## 2013-05-06 NOTE — Progress Notes (Signed)
Patient remains hypotensive. Dr. Janee Morn paged and made aware. Albumin ordered.  See MAR. Will continue to monitor. Jacqulynn Cadet
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
## 2013-05-06 NOTE — ED Notes (Signed)
EDP at bedside  
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
## 2013-05-07 ENCOUNTER — Inpatient Hospital Stay (HOSPITAL_COMMUNITY): Payer: Medicaid Other

## 2013-05-07 LAB — CBC
MCH: 35.7 pg — ABNORMAL HIGH (ref 26.0–34.0)
Platelets: 104 10*3/uL — ABNORMAL LOW (ref 150–400)
RBC: 2.49 MIL/uL — ABNORMAL LOW (ref 3.87–5.11)
WBC: 19.4 10*3/uL — ABNORMAL HIGH (ref 4.0–10.5)

## 2013-05-07 LAB — POCT I-STAT 3, ART BLOOD GAS (G3+)
O2 Saturation: 100 %
Patient temperature: 100.2
TCO2: 21 mmol/L (ref 0–100)
pCO2 arterial: 36.3 mmHg (ref 35.0–45.0)

## 2013-05-07 LAB — BLOOD GAS, ARTERIAL
Acid-base deficit: 3.4 mmol/L — ABNORMAL HIGH (ref 0.0–2.0)
Bicarbonate: 20.3 mEq/L (ref 20.0–24.0)
Drawn by: 39866
FIO2: 0.3 %
O2 Saturation: 99.5 %
RATE: 15 resp/min
TCO2: 21.3 mmol/L (ref 0–100)
pO2, Arterial: 111 mmHg — ABNORMAL HIGH (ref 80.0–100.0)

## 2013-05-07 LAB — ABO/RH: ABO/RH(D): O POS

## 2013-05-07 LAB — BASIC METABOLIC PANEL
CO2: 20 mEq/L (ref 19–32)
Calcium: 8.1 mg/dL — ABNORMAL LOW (ref 8.4–10.5)
GFR calc Af Amer: >90 mL/min (ref >90)
Sodium: 142 mEq/L (ref 135–145)

## 2013-05-07 LAB — GLUCOSE, CAPILLARY: Glucose-Capillary: 125 mg/dL — ABNORMAL HIGH (ref 70–99)

## 2013-05-07 MED ORDER — VITAMIN E 15 UNIT/0.3ML PO SOLN
400.0000 [IU] | Freq: Three times a day (TID) | ORAL | Status: AC
  Administered 2013-05-07 – 2013-05-13 (×21): 400 [IU]
  Filled 2013-05-07 (×22): qty 8

## 2013-05-07 MED ORDER — PIVOT 1.5 CAL PO LIQD
1000.0000 mL | ORAL | Status: DC
  Administered 2013-05-07 – 2013-05-10 (×4): 1000 mL
  Filled 2013-05-07 (×6): qty 1000

## 2013-05-07 MED ORDER — SELENIUM 50 MCG PO TABS
200.0000 ug | ORAL_TABLET | Freq: Every day | ORAL | Status: AC
  Administered 2013-05-07 – 2013-05-13 (×7): 200 ug
  Filled 2013-05-07 (×8): qty 4

## 2013-05-07 MED ORDER — SODIUM CHLORIDE 0.9 % IJ SOLN
10.0000 mL | INTRAMUSCULAR | Status: DC | PRN
  Administered 2013-05-16: 10 mL

## 2013-05-07 MED ORDER — MANNITOL 25 % IV SOLN
25.0000 g | Freq: Four times a day (QID) | INTRAVENOUS | Status: AC
  Administered 2013-05-07 – 2013-05-09 (×10): 25 g via INTRAVENOUS
  Filled 2013-05-07 (×5): qty 100
  Filled 2013-05-07 (×2): qty 50
  Filled 2013-05-07: qty 100
  Filled 2013-05-07: qty 50
  Filled 2013-05-07 (×4): qty 100
  Filled 2013-05-07: qty 50
  Filled 2013-05-07 (×2): qty 100
  Filled 2013-05-07: qty 50
  Filled 2013-05-07 (×2): qty 100
  Filled 2013-05-07 (×2): qty 50
  Filled 2013-05-07 (×2): qty 100

## 2013-05-07 MED ORDER — ACETAMINOPHEN 160 MG/5ML PO SOLN
650.0000 mg | Freq: Four times a day (QID) | ORAL | Status: DC | PRN
Start: 2013-05-07 — End: 2013-05-19
  Administered 2013-05-07 – 2013-05-17 (×7): 650 mg
  Filled 2013-05-07 (×7): qty 20.3

## 2013-05-07 MED ORDER — VITAMIN C 500 MG PO TABS
1000.0000 mg | ORAL_TABLET | Freq: Three times a day (TID) | ORAL | Status: AC
  Administered 2013-05-07 – 2013-05-13 (×21): 1000 mg
  Filled 2013-05-07 (×21): qty 2
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
## 2013-05-07 NOTE — Procedures (Signed)
Arterial Catheter Insertion Procedure Note Kaitlin Wong [id number] June 14, 1981  Procedure: Insertion of Arterial Catheter  Indications: Frequent blood sampling  Procedure Details Consent: Risks of procedure as well as the alternatives and risks of each were explained to the (patient/caregiver).  Consent for procedure obtained. Time Out: Verified patient identification, verified procedure, site/side was marked, verified correct patient position, special equipment/implants available, medications/allergies/relevent history reviewed, required imaging and test results available.  Performed  Maximum sterile technique was used including antiseptics, cap, gloves, gown, hand hygiene, mask and sheet. Skin prep: Chlorhexidine; local anesthetic administered 20 gauge catheter was inserted into right radial artery using the Seldinger technique.  Evaluation Blood flow good; BP tracing good. Complications: No apparent complications.   Morley Kos 05/07/2013

## 2013-05-07 NOTE — Progress Notes (Signed)
Peripherally Inserted Central Catheter/Midline Placement  The IV Nurse has discussed with the patient and/or persons authorized to consent for the patient, the purpose of this procedure and the potential benefits and risks involved with this procedure.  The benefits include less needle sticks, lab draws from the catheter and patient may be discharged home with the catheter.  Risks include, but not limited to, infection, bleeding, blood clot (thrombus formation), and puncture of an artery; nerve damage and irregular heat beat.  Alternatives to this procedure were also discussed.  PICC/Midline Placement Documentation  PICC Triple Lumen 05/07/13 PICC Left Basilic (Active)  Indication for Insertion or Continuance of Line Prolonged intravenous therapies 05/07/2013 12:00 PM  Length mark (cm) 1 cm 05/07/2013 12:00 PM  Site Assessment Clean;Dry;Intact 05/07/2013 12:00 PM  Lumen #1 Status Flushed;Saline locked;Blood return noted 05/07/2013 12:00 PM  Lumen #2 Status Flushed;Saline locked;Blood return noted 05/07/2013 12:00 PM  Lumen #3 Status Flushed;Saline locked;Blood return noted 05/07/2013 12:00 PM  Dressing Type Transparent 05/07/2013 12:00 PM  Dressing Status Clean;Dry;Intact;Antimicrobial disc in place 05/07/2013 12:00 PM  Line Care Connections checked and tightened 05/07/2013 12:00 PM  Dressing Intervention New dressing 05/07/2013 12:00 PM  Dressing Change Due 05/14/13 05/07/2013 12:00 PM       Ronie Spies [facility] 05/07/2013, 12:48 PM
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
## 2013-05-08 ENCOUNTER — Inpatient Hospital Stay (HOSPITAL_COMMUNITY): Payer: Medicaid Other

## 2013-05-08 LAB — CBC
MCV: 99.5 fL (ref 78.0–100.0)
Platelets: 75 10*3/uL — ABNORMAL LOW (ref 150–400)
RBC: 2.08 MIL/uL — ABNORMAL LOW (ref 3.87–5.11)
RDW: 14 % (ref 11.5–15.5)
WBC: 14.6 10*3/uL — ABNORMAL HIGH (ref 4.0–10.5)

## 2013-05-08 LAB — BASIC METABOLIC PANEL
Chloride: 112 mEq/L (ref 96–112)
Creatinine, Ser: 0.56 mg/dL (ref 0.50–1.10)
GFR calc Af Amer: >90 mL/min (ref >90)
Sodium: 141 mEq/L (ref 135–145)

## 2013-05-08 LAB — GLUCOSE, CAPILLARY
Glucose-Capillary: 118 mg/dL — ABNORMAL HIGH (ref 70–99)
Glucose-Capillary: 116 mg/dL — ABNORMAL HIGH (ref 70–99)

## 2013-05-08 LAB — BLOOD GAS, ARTERIAL
Drawn by: 232811
MECHVT: 500 mL
PEEP: 5 cmH2O
RATE: 14 resp/min
pCO2 arterial: 38.9 mmHg (ref 35.0–45.0)
pH, Arterial: 7.404 (ref 7.350–7.450)
pO2, Arterial: 133 mmHg — ABNORMAL HIGH (ref 80.0–100.0)
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
## 2013-05-08 NOTE — Progress Notes (Signed)
Patient ID: Kaitlin Wong, female   DOB: May 09, 1981, 32 y.o.   MRN: [id number] Afeb. vss No new neuro changes. Has had no real response since arrival. Pupils still about 3 mm and non responsive.  No change with mannitol Will recheck CT tomorrow.

## 2013-05-08 NOTE — Progress Notes (Signed)
Follow up - Trauma and Critical Care  Patient Details:    Kaitlin Wong is an 32 y.o. female.  Lines/tubes : Airway 7.5 mm (Active)  Secured at (cm) 23 cm 05/08/2013  3:00 AM  Measured From Lips 05/08/2013  3:00 AM  Secured Location Right 05/08/2013  3:00 AM  Secured By [employer] 05/08/2013  3:00 AM  Tube Holder Repositioned Yes 05/08/2013  3:00 AM  Cuff Pressure (cm H2O) 26 cm H2O 05/07/2013  7:19 PM  Site Condition Dry 05/07/2013  3:10 PM     PICC Triple Lumen 05/07/13 PICC Left Basilic (Active)  Indication for Insertion or Continuance of Line Prolonged intravenous therapies 05/08/2013  8:00 AM  Length mark (cm) 1 cm 05/07/2013 12:00 PM  Site Assessment Clean;Dry;Intact 05/08/2013  8:00 AM  Lumen #1 Status Flushed;Blood return noted;Saline locked 05/08/2013  8:00 AM  Lumen #2 Status Flushed;Blood return noted;Saline locked 05/08/2013  8:00 AM  Lumen #3 Status Flushed;Blood return noted;Infusing 05/08/2013  8:00 AM  Dressing Type Transparent 05/08/2013  8:00 AM  Dressing Status Clean;Dry;Intact 05/08/2013  8:00 AM  Line Care Connections checked and tightened 05/08/2013  8:00 AM  Dressing Intervention New dressing 05/07/2013 12:00 PM  Dressing Change Due 05/14/13 05/07/2013 12:00 PM     Arterial Line 05/07/13 Right Radial (Active)  Site Assessment Clean;Dry;Intact 05/08/2013  8:00 AM  Line Status Pulsatile blood flow 05/08/2013  8:00 AM  Art Line Waveform Whip 05/08/2013  8:00 AM  Art Line Interventions Zeroed and calibrated;Leveled;Flushed per protocol;Connections checked and tightened 05/08/2013  8:00 AM  Color/Movement/Sensation Capillary refill less than 3 sec 05/08/2013  8:00 AM  Dressing Type Transparent 05/08/2013  8:00 AM  Dressing Status Clean;Dry;Intact 05/08/2013  8:00 AM     NG/OG Tube Nasogastric 16 Fr. Left nare (Active)  Placement Verification Auscultation 05/08/2013  8:00 AM  Site Assessment Dry;Intact;Clean 05/08/2013  8:00 AM  Status Infusing tube feed 05/08/2013  8:00  AM  Amount of suction 80 mmHg 04/12/2013 10:36 PM  Drainage Appearance Tan 05/08/2013  8:00 AM  Gastric Residual 15 mL 05/08/2013  8:00 AM  Intake (mL) 40 mL 05/08/2013  9:09 AM  Output (mL) 50 mL 05/07/2013  4:00 AM     Urethral Catheter Temperature probe 14 Fr. (Active)  Indication for Insertion or Continuance of Catheter Unstable critical patients (first 24-48 hours) 05/07/2013  8:00 PM  Site Assessment Clean;Dry;Intact 05/07/2013  8:00 PM  Collection Container Standard drainage bag 05/07/2013  8:00 PM  Securement Method Leg strap 05/07/2013  8:00 PM  Urinary Catheter Interventions Unclamped 05/07/2013  8:00 PM    Microbiology/Sepsis markers: Results for orders placed during the hospital encounter of 05/04/2013  MRSA PCR SCREENING     Status: None   Collection Time    05/06/13  1:20 AM      Result Value Range Status   MRSA by PCR NEGATIVE  NEGATIVE Final   Comment:            The GeneXpert MRSA Assay (FDA     approved for NASAL specimens     only), is one component of a     comprehensive MRSA colonization     surveillance program. It is not     intended to diagnose MRSA     infection nor to guide or     monitor treatment for     MRSA infections.    Anti-infectives:  Anti-infectives   Start     Dose/Rate Route Frequency Ordered Stop   05/06/13  0200  ceFAZolin (ANCEF) IVPB 2 g/50 mL premix     2 g 100 mL/hr over 30 Minutes Intravenous Every 8 hours 05/06/13 0120 05/07/13 1830      Best Practice/Protocols:  VTE Prophylaxis: Mechanical GI Prophylaxis: Proton Pump Inhibitor No sedation.  Consults:      Events:  Subjective:    Overnight Issues: Patient still comatose.  Has some flickering of her lower extremities.  Objective:  Vital signs for last 24 hours: Temp:  [98.6 F (37 C)-101.5 F (38.6 C)] 99.5 F (37.5 C) (06/30 0900) Pulse Rate:  [59-113] 81 (06/30 0900) Resp:  [14-28] 28 (06/30 0900) BP: (114-161)/(44-90) 134/73 mmHg (06/30 0900) SpO2:  [97 %-100 %] 99  % (06/30 0900) FiO2 (%):  [30 %] 30 % (06/30 0900) Weight:  [52.4 kg (115 lb 8.3 oz)] 52.4 kg (115 lb 8.3 oz) (06/30 0500)  Hemodynamic parameters for last 24 hours:    Intake/Output from previous day: 06/29 0701 - 06/30 0700 In: 3150 [I.V.:2400; NG/GT:700; IV Piggyback:50] Out: 2060 [Urine:2060]  Intake/Output this shift: Total I/O In: 280 [I.V.:200; NG/GT:80] Out: 530 [Urine:530]  Vent settings for last 24 hours: Vent Mode:  [-] PRVC FiO2 (%):  [30 %] 30 % Set Rate:  [14 bmp] 14 bmp Vt Set:  [500 mL] 500 mL PEEP:  [5 cmH20] 5 cmH20 Plateau Pressure:  [22 cmH20-23 cmH20] 22 cmH20  Physical Exam:  General: Unresponsive Neuro: RASS -3 or deeper and myoclonus Resp: clear to auscultation bilaterally GI: soft, nontender, BS WNL, no r/g and tolerating tube feedings well. Extremities: edema 1+ and occasional spasmotic movement of her lower extremities  Results for orders placed during the hospital encounter of 04/26/2013 (from the past 24 hour(s))  GLUCOSE, CAPILLARY     Status: Abnormal   Collection Time    05/07/13 12:55 PM      Result Value Range   Glucose-Capillary 104 (*) 70 - 99 mg/dL  POCT I-STAT 3, BLOOD GAS (G3+)     Status: Abnormal   Collection Time    05/07/13  3:13 PM      Result Value Range   pH, Arterial 7.361  7.350 - 7.450   pCO2 arterial 36.3  35.0 - 45.0 mmHg   pO2, Arterial 185.0 (*) 80.0 - 100.0 mmHg   Bicarbonate 20.4  20.0 - 24.0 mEq/L   TCO2 21  0 - 100 mmol/L   O2 Saturation 100.0     Acid-base deficit 4.0 (*) 0.0 - 2.0 mmol/L   Patient temperature 100.2 F     Collection site ARTERIAL LINE     Drawn by RT     Sample type ARTERIAL    GLUCOSE, CAPILLARY     Status: None   Collection Time    05/07/13  3:42 PM      Result Value Range   Glucose-Capillary 90  70 - 99 mg/dL  GLUCOSE, CAPILLARY     Status: Abnormal   Collection Time    05/07/13  8:30 PM      Result Value Range   Glucose-Capillary 125 (*) 70 - 99 mg/dL  GLUCOSE, CAPILLARY      Status: Abnormal   Collection Time    05/07/13 11:10 PM      Result Value Range   Glucose-Capillary 118 (*) 70 - 99 mg/dL  GLUCOSE, CAPILLARY     Status: Abnormal   Collection Time    05/08/13  3:19 AM      Result Value Range   Glucose-Capillary 122 (*)  70 - 99 mg/dL  BLOOD GAS, ARTERIAL     Status: Abnormal   Collection Time    05/08/13  3:35 AM      Result Value Range   FIO2 0.30     Delivery systems VENTILATOR     Mode PRESSURE REGULATED VOLUME CONTROL     VT 500     Rate 14     Peep/cpap 5.0     pH, Arterial 7.404  7.350 - 7.450   pCO2 arterial 38.9  35.0 - 45.0 mmHg   pO2, Arterial 133.0 (*) 80.0 - 100.0 mmHg   Bicarbonate 23.9  20.0 - 24.0 mEq/L   TCO2 25.1  0 - 100 mmol/L   Acid-base deficit 0.3  0.0 - 2.0 mmol/L   O2 Saturation 99.2     Patient temperature 98.6     Collection site A-LINE     Drawn by [id number]     Sample type ARTERIAL DRAW     Allens test (pass/fail) PASS  PASS  CBC     Status: Abnormal   Collection Time    05/08/13  5:00 AM      Result Value Range   WBC 14.6 (*) 4.0 - 10.5 K/uL   RBC 2.08 (*) 3.87 - 5.11 MIL/uL   Hemoglobin 7.2 (*) 12.0 - 15.0 g/dL   HCT 91.4 (*) 78.2 - 95.6 %   MCV 99.5  78.0 - 100.0 fL   MCH 34.6 (*) 26.0 - 34.0 pg   MCHC 34.8  30.0 - 36.0 g/dL   RDW 21.3  08.6 - 57.8 %   Platelets 75 (*) 150 - 400 K/uL  BASIC METABOLIC PANEL     Status: Abnormal   Collection Time    05/08/13  5:00 AM      Result Value Range   Sodium 141  135 - 145 mEq/L   Potassium 4.3  3.5 - 5.1 mEq/L   Chloride 112  96 - 112 mEq/L   CO2 25  19 - 32 mEq/L   Glucose, Bld 136 (*) 70 - 99 mg/dL   BUN 10  6 - 23 mg/dL   Creatinine, Ser 4.69  0.50 - 1.10 mg/dL   Calcium 8.6  8.4 - 62.9 mg/dL   GFR calc non Af Amer >90  >90 mL/min   GFR calc Af Amer >90  >90 mL/min  GLUCOSE, CAPILLARY     Status: Abnormal   Collection Time    05/08/13  7:51 AM      Result Value Range   Glucose-Capillary 116 (*) 70 - 99 mg/dL     Assessment/Plan:   NEURO   Altered Mental Status:  coma Trauma-CNS:  coma and intracranial injury   Plan: CPM, will get repeat CT tomorrow AM.  Getting mannitol per NS  PULM  No issues   Plan: CPM  CARDIO  Sinus Tachycardia   Plan: No specific treatment  RENAL  No issues   Plan: CPM  GI  No issues   Plan: CPM  ID  No issues   Plan: CPM  HEME  Anemia acute blood loss anemia and anemia of critical illness)   Plan: Will transfuse one unit of PRBCs  ENDO No specific issues   Plan: CPM  Global Issues  Scan shhows significant intraparenchymal hemorrhages, but nothing that seems to be surgically treatable.  The intracranial pressure does not seem to be that increased.    LOS: 3 days   Additional comments:I reviewed the patient's new clinical  lab test results. cbc/bmet and I reviewed the patients new imaging test results. cxr  Critical Care Total Time*: 30 Minutes  Jilian West O 05/08/2013  *Care during the described time interval was provided by me and/or other providers on the critical care team.  I have reviewed this patient's available data, including medical history, events of note, physical examination and test results as part of my evaluation.

## 2013-05-08 NOTE — Progress Notes (Signed)
UR completed 
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
## 2013-05-09 ENCOUNTER — Inpatient Hospital Stay (HOSPITAL_COMMUNITY): Payer: Medicaid Other

## 2013-05-09 ENCOUNTER — Encounter (HOSPITAL_COMMUNITY): Payer: Self-pay | Admitting: Radiology

## 2013-05-09 DIAGNOSIS — D62 Acute posthemorrhagic anemia: Secondary | ICD-10-CM

## 2013-05-09 DIAGNOSIS — R509 Fever, unspecified: Secondary | ICD-10-CM

## 2013-05-09 LAB — TYPE AND SCREEN
ABO/RH(D): O POS
Unit division: 0
Unit division: 0
Unit division: 0

## 2013-05-09 LAB — GLUCOSE, CAPILLARY
Glucose-Capillary: 106 mg/dL — ABNORMAL HIGH (ref 70–99)
Glucose-Capillary: 114 mg/dL — ABNORMAL HIGH (ref 70–99)

## 2013-05-09 LAB — BLOOD GAS, ARTERIAL
Acid-Base Excess: 2.4 mmol/L — ABNORMAL HIGH (ref 0.0–2.0)
Bicarbonate: 26 mEq/L — ABNORMAL HIGH (ref 20.0–24.0)
MECHVT: 500 mL
O2 Saturation: 98.7 %
PEEP: 5 cmH2O
Patient temperature: 98.6
RATE: 14 resp/min
TCO2: 27.1 mmol/L (ref 0–100)
pH, Arterial: 7.464 — ABNORMAL HIGH (ref 7.350–7.450)
pO2, Arterial: 125 mmHg — ABNORMAL HIGH (ref 80.0–100.0)

## 2013-05-09 MED ORDER — BIOTENE DRY MOUTH MT LIQD
15.0000 mL | Freq: Four times a day (QID) | OROMUCOSAL | Status: DC
  Administered 2013-05-10 – 2013-05-17 (×30): 15 mL via OROMUCOSAL
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
## 2013-05-09 NOTE — Progress Notes (Signed)
Dr.Wyatt is notified about pt's unequal pupils. Pupils are still nonreactive, but right pupil is about 3mm, the left one is about 2mm. Otherwise pt's condition is without changes, stable vital signs and unresponsive. No new orders received

## 2013-05-09 DEATH — deceased

## 2013-05-10 ENCOUNTER — Inpatient Hospital Stay (HOSPITAL_COMMUNITY): Payer: Medicaid Other

## 2013-05-10 DIAGNOSIS — D62 Acute posthemorrhagic anemia: Secondary | ICD-10-CM | POA: Diagnosis not present

## 2013-05-10 DIAGNOSIS — J9600 Acute respiratory failure, unspecified whether with hypoxia or hypercapnia: Secondary | ICD-10-CM | POA: Diagnosis present

## 2013-05-10 LAB — BASIC METABOLIC PANEL
CO2: 26 mEq/L (ref 19–32)
Calcium: 8.6 mg/dL (ref 8.4–10.5)
GFR calc non Af Amer: >90 mL/min (ref >90)
Glucose, Bld: 124 mg/dL — ABNORMAL HIGH (ref 70–99)
Potassium: 4.3 mEq/L (ref 3.5–5.1)
Sodium: 147 mEq/L — ABNORMAL HIGH (ref 135–145)

## 2013-05-10 LAB — BLOOD GAS, ARTERIAL
Acid-Base Excess: 2.5 mmol/L — ABNORMAL HIGH (ref 0.0–2.0)
Bicarbonate: 26.1 mEq/L — ABNORMAL HIGH (ref 20.0–24.0)
FIO2: 0.3 %
O2 Saturation: 98.8 %
Patient temperature: 100.4
pO2, Arterial: 97 mmHg (ref 80.0–100.0)

## 2013-05-10 LAB — CBC
Hemoglobin: 6.8 g/dL — CL (ref 12.0–15.0)
MCH: 34.3 pg — ABNORMAL HIGH (ref 26.0–34.0)
Platelets: 120 10*3/uL — ABNORMAL LOW (ref 150–400)
RBC: 1.98 MIL/uL — ABNORMAL LOW (ref 3.87–5.11)

## 2013-05-10 LAB — URINALYSIS, ROUTINE W REFLEX MICROSCOPIC
Bilirubin Urine: NEGATIVE
Hgb urine dipstick: NEGATIVE
Ketones, ur: NEGATIVE mg/dL
Nitrite: NEGATIVE
pH: 7.5 (ref 5.0–8.0)

## 2013-05-10 LAB — GLUCOSE, CAPILLARY
Glucose-Capillary: 106 mg/dL — ABNORMAL HIGH (ref 70–99)
Glucose-Capillary: 113 mg/dL — ABNORMAL HIGH (ref 70–99)

## 2013-05-10 MED ORDER — BACITRACIN ZINC 500 UNIT/GM EX OINT
1.0000 "application " | TOPICAL_OINTMENT | Freq: Two times a day (BID) | CUTANEOUS | Status: DC
  Administered 2013-05-10 – 2013-05-16 (×13): 1 via TOPICAL
  Filled 2013-05-10: qty 15

## 2013-05-10 NOTE — Progress Notes (Signed)
2440 Notified MD of critical value Hgb of 6.8. No orders received at this time.
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
## 2013-05-10 NOTE — Progress Notes (Signed)
Patient transported to CT.  No complications during trip.
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
## 2013-05-10 NOTE — Progress Notes (Signed)
Prognosis continues to look poor.  Will have family conference tomorrow to help decide on disposition.  This patient has been seen and I agree with the findings and treatment plan.  Marta Lamas. Gae Bon, MD, FACS [phone] (pager) [phone] (direct pager) Trauma Surgeon

## 2013-05-10 NOTE — Progress Notes (Signed)
CRITICAL VALUE ALERT  Critical value received:  Hgb 6.8  Date of notification:  05/10/13  Time of notification:  0510  Critical value read back:yes  Nurse who received alert:  Myrtis Hopping RN  MD notified (1st page):  Wakefiel,d MD  Time of first page:  [phone]  MD notified (2nd page):  Time of second page:  Responding MD:  Dwain Sarna, MD  Time MD responded:  [phone]

## 2013-05-11 ENCOUNTER — Inpatient Hospital Stay (HOSPITAL_COMMUNITY): Payer: Medicaid Other

## 2013-05-11 LAB — BASIC METABOLIC PANEL
BUN: 15 mg/dL (ref 6–23)
CO2: 25 mEq/L (ref 19–32)
Calcium: 8.6 mg/dL (ref 8.4–10.5)
Creatinine, Ser: 0.43 mg/dL — ABNORMAL LOW (ref 0.50–1.10)
GFR calc non Af Amer: >90 mL/min (ref >90)
Glucose, Bld: 115 mg/dL — ABNORMAL HIGH (ref 70–99)
Sodium: 145 mEq/L (ref 135–145)

## 2013-05-11 LAB — CBC
Hemoglobin: 6.5 g/dL — CL (ref 12.0–15.0)
MCH: 34.9 pg — ABNORMAL HIGH (ref 26.0–34.0)
MCHC: 34.9 g/dL (ref 30.0–36.0)
MCV: 100 fL (ref 78.0–100.0)

## 2013-05-11 MED ORDER — PIPERACILLIN-TAZOBACTAM 3.375 G IVPB
3.3750 g | Freq: Three times a day (TID) | INTRAVENOUS | Status: DC
  Administered 2013-05-11 – 2013-05-15 (×11): 3.375 g via INTRAVENOUS
  Filled 2013-05-11 (×15): qty 50

## 2013-05-11 MED ORDER — VANCOMYCIN HCL 500 MG IV SOLR
500.0000 mg | Freq: Two times a day (BID) | INTRAVENOUS | Status: DC
  Administered 2013-05-11 – 2013-05-14 (×8): 500 mg via INTRAVENOUS
  Filled 2013-05-11 (×11): qty 500

## 2013-05-11 MED ORDER — PIVOT 1.5 CAL PO LIQD
1000.0000 mL | ORAL | Status: DC
  Administered 2013-05-11: 1000 mL
  Filled 2013-05-11 (×4): qty 1000
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
## 2013-05-11 NOTE — Progress Notes (Signed)
Family conference held with the patient's mother, aunt, cousing, Education officer, environmental and uncle (by phone in [place]).  They were all very understanding of this terrible situation.  I told them that she would very likely not return to a functional status.  She would require a tracheostomy and feeding tube, and likely remain on the ventilator for a prolonged time, if not forever.  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  They were all in agreement that she should not exist like this, and they will likely withdraw support in the near future.  Immediately we will make the patient a DNR.  Marta Lamas. Gae Bon, MD, FACS [phone] Trauma Surgeon
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
## 2013-05-11 NOTE — Progress Notes (Signed)
Long in-depth family conference
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
## 2013-05-11 NOTE — Clinical Social Work Note (Signed)
Per MD note: "Family conference held with the patient's mother, aunt, cousin, Education officer, environmental and uncle (by phone in [place]). They were all very understanding of this terrible situation. I told them that she would very likely not return to a functional status. She would require a tracheostomy and feeding tube, and likely remain on the ventilator for a prolonged time, if not forever. I specifically told them that she was not brain dead, but perhaps worse because of chronic vegetative state. They were all in agreement that she should not exist like this, and they will likely withdraw support in the near future. Immediately we will make the patient a DNR."    Clinical Social Worker remained with RN to support family and escort back to patient room.  Patient family all in agreement with plan and are appropriately coping with unfortunate situation at hand.  Patient family aware of how to reach CSW if needed.  CSW will continue to offer support and follow up with family.  Macario Golds, [place] [phone]

## 2013-05-12 ENCOUNTER — Inpatient Hospital Stay (HOSPITAL_COMMUNITY): Payer: Medicaid Other

## 2013-05-12 LAB — BASIC METABOLIC PANEL
Calcium: 8.3 mg/dL — ABNORMAL LOW (ref 8.4–10.5)
Creatinine, Ser: 0.49 mg/dL — ABNORMAL LOW (ref 0.50–1.10)
GFR calc non Af Amer: >90 mL/min (ref >90)
Sodium: 146 mEq/L — ABNORMAL HIGH (ref 135–145)

## 2013-05-12 LAB — CBC
MCH: 33.1 pg (ref 26.0–34.0)
MCHC: 33.3 g/dL (ref 30.0–36.0)
MCV: 99.4 fL (ref 78.0–100.0)
Platelets: 229 10*3/uL (ref 150–400)
RBC: 1.78 MIL/uL — ABNORMAL LOW (ref 3.87–5.11)
RDW: 14 % (ref 11.5–15.5)

## 2013-05-12 MED ORDER — PANTOPRAZOLE SODIUM 40 MG PO PACK
40.0000 mg | PACK | Freq: Two times a day (BID) | ORAL | Status: DC
Start: 2013-05-12 — End: 2013-05-17
  Administered 2013-05-12 – 2013-05-16 (×10): 40 mg
  Filled 2013-05-12 (×12): qty 20

## 2013-05-12 MED ORDER — PIVOT 1.5 CAL PO LIQD
1000.0000 mL | ORAL | Status: DC
  Administered 2013-05-12 – 2013-05-16 (×5): 1000 mL
  Filled 2013-05-12 (×7): qty 1000

## 2013-05-12 NOTE — Progress Notes (Signed)
No change in neurologic status.  Probably will not die when removed from ventilator in a short period of time.  Family at the bedside.  Marta Lamas. Gae Bon, MD, FACS [phone] Trauma Surgeon
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
## 2013-05-12 NOTE — Progress Notes (Signed)
No changes. Bilateral decerebration. Family to decide about further treatment
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
## 2013-05-14 LAB — URINE CULTURE

## 2013-05-14 LAB — CULTURE, RESPIRATORY W GRAM STAIN
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
## 2013-05-14 NOTE — Progress Notes (Signed)
The patient is not brain dead, so the time table on withdrawing support is not the same.  Waiting until Friday of next week I believe is too long.  Will discuss with family and out team.  Marta Lamas. Gae Bon, MD, FACS [phone] Trauma Surgeon

## 2013-05-14 NOTE — Progress Notes (Signed)
Subjective: Patient continues on ventilator via endotracheal tube. Immobilized in [place] cervical collar.  Objective: Vital signs in last 24 hours: Filed Vitals:   05/14/13 0600 05/14/13 0800 05/14/13 0817 05/14/13 0900  BP: 99/49 131/50 131/50 109/62  Pulse: 88 89 97 92  Temp: 99.5 F (37.5 C) 99.9 F (37.7 C)  99.5 F (37.5 C)  TempSrc:      Resp: 15 16 14 14   Height:      Weight:      SpO2: 100% 100%  100%    Intake/Output from previous day: 07/05 0701 - 07/06 0700 In: 2940 [I.V.:1200; NG/GT:1440; IV Piggyback:300] Out: 1990 [Urine:1990] Intake/Output this shift: Total I/O In: 180 [I.V.:100; NG/GT:80] Out: -   Physical Exam:  None opening eyes to voice or pain. Anisocoria persists, with left pupil larger than right pupil. Neither reactive to light. Left corneal reflex remains present, right corner reflex remains absent. No movement to deep central pain.  CBC  Recent Labs  05/12/13 0435  WBC 17.1*  HGB 5.9*  HCT 17.7*  PLT 229   BMET  Recent Labs  05/12/13 0435  NA 146*  K 4.1  CL 112  CO2 25  GLUCOSE 123*  BUN 15  CREATININE 0.49*  CALCIUM 8.3*    Assessment/Plan: No improvement in neurologic condition.   Hewitt Shorts, MD 05/14/2013, 9:17 AM

## 2013-05-15 LAB — BASIC METABOLIC PANEL
Calcium: 9.2 mg/dL (ref 8.4–10.5)
GFR calc Af Amer: >90 mL/min (ref >90)
GFR calc non Af Amer: >90 mL/min (ref >90)
Glucose, Bld: 116 mg/dL — ABNORMAL HIGH (ref 70–99)
Potassium: 4.4 meq/L (ref 3.5–5.1)
Sodium: 143 meq/L (ref 135–145)

## 2013-05-15 MED ORDER — ALBUMIN HUMAN 5 % IV SOLN
12.5000 g | Freq: Once | INTRAVENOUS | Status: AC
  Administered 2013-05-15: 12.5 g via INTRAVENOUS
  Filled 2013-05-15: qty 250

## 2013-05-15 MED ORDER — DEXTROSE 5 % IV SOLN
2.0000 g | INTRAVENOUS | Status: DC
  Administered 2013-05-15 – 2013-05-16 (×2): 2 g via INTRAVENOUS
  Filled 2013-05-15 (×4): qty 2

## 2013-05-15 NOTE — Progress Notes (Signed)
Covering Clinical Child psychotherapist (CSW) to remain available for support.  Theresia Bough, MSW, Theresia Majors [phone]

## 2013-05-15 NOTE — Progress Notes (Signed)
Pt's family portrayed the plan to withdrawal care on Wednesday 05/17/13 in request that Dr. Lindie Spruce could be present.  Will continue to monitor.

## 2013-05-15 NOTE — Progress Notes (Signed)
Systolic BP in the 90's and MAP is 61. Notified Dr. Janee Morn; albumin human 5% solution 12.5g ordered. Will continue to monitor.
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
## 2013-05-15 NOTE — Progress Notes (Signed)
UR completed 
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
## 2013-05-16 LAB — CBC
HCT: 21.4 % — ABNORMAL LOW (ref 36.0–46.0)
Hemoglobin: 7.1 g/dL — ABNORMAL LOW (ref 12.0–15.0)
MCHC: 33.2 g/dL (ref 30.0–36.0)
RDW: 14.4 % (ref 11.5–15.5)
WBC: 22.7 10*3/uL — ABNORMAL HIGH (ref 4.0–10.5)

## 2013-05-16 LAB — BASIC METABOLIC PANEL
BUN: 19 mg/dL (ref 6–23)
Chloride: 109 mEq/L (ref 96–112)
Creatinine, Ser: 0.43 mg/dL — ABNORMAL LOW (ref 0.50–1.10)
GFR calc Af Amer: >90 mL/min (ref >90)
GFR calc non Af Amer: >90 mL/min (ref >90)
Potassium: 4.1 mEq/L (ref 3.5–5.1)

## 2013-05-16 LAB — CULTURE, BLOOD (ROUTINE X 2)
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
## 2013-05-17 MED ORDER — SODIUM CHLORIDE 0.9 % IV SOLN
100.0000 ug/h | INTRAVENOUS | Status: DC
  Administered 2013-05-17: 50 ug/h via INTRAVENOUS
  Administered 2013-05-18: 150 ug/h via INTRAVENOUS
  Filled 2013-05-17 (×3): qty 50

## 2013-05-17 MED ORDER — SODIUM CHLORIDE 0.9 % IJ SOLN: 10.0000 mL | INTRAMUSCULAR | Status: DC | PRN

## 2013-05-17 NOTE — Procedures (Signed)
Extubation Procedure Note  Patient Details:   Name: Kaitlin Wong DOB: [birth year] MRN: [id number]   Airway Documentation:     Evaluation  O2 sats: currently acceptable Complications: No apparent complications Patient did tolerate procedure well. Bilateral Breath Sounds: Clear Suctioning: Airway No pt extubated for withdrawal of care with no plans of re-intubation. Pt placed on 2L [place] per MD order. RT will continue to monitor.   Loyal Jacobson [facility] 05/17/2013, 11:41 AM

## 2013-05-17 NOTE — Progress Notes (Signed)
The family has come by to see the patient. We will terminally extubate patient for withdrawal of care.  Marta Lamas. Gae Bon, MD, FACS [phone] Trauma Surgeon

## 2013-05-17 NOTE — Progress Notes (Signed)
Family wishing to withdraw support later this AM.  I will be available for the family as needed.  Marta Lamas. Gae Bon, MD, FACS [phone] Trauma Surgeon

## 2013-05-18 NOTE — Progress Notes (Signed)
Pt no code blue, family at bedside.  Unresponsive, no respiration, no apical pulse, no radial pulse.  Time of death 27.  PA notified, order for RN to pronounce given.  Confirmed with Sherran Needs, RN, MSN, CMSRN.
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
## 2013-05-18 NOTE — Progress Notes (Signed)
250mg  Fentanyl IV bag wasted in sink, witnessed by Chuck Hint, RN.

## 2013-05-18 NOTE — Progress Notes (Signed)
Nutrition Brief Note  Chart reviewed. Patient was extubated yesterday; now transitioning to comfort care. No further nutrition interventions warranted at this time.  Please re-consult as needed.   Joaquin Courts, RD, LDN, CNSC Pager [phone] After Hours Pager [phone]

## 2013-05-18 NOTE — Progress Notes (Signed)
Upon arriving to unit, learned pt had just passed. Pt's mother, grandmother, and several friends were bedside when I arrived. Family was appropriately grieving and making necessary calls. Offered emotional support; lead a discussion in talking about memories of pt; and offered prayer of comfort. Family was grateful for Chaplain support. Marjory Lies Chaplain  05/21/2013 1800  Clinical Encounter Type  Visited With Family

## 2013-05-18 NOTE — Progress Notes (Signed)
Pt began breathing agonally during transport. Upon arrival to [address] we called family to bedside right away. A few more breaths were taken. Currently pt is not breathing and has no pulse. Mother at bedside with patient.

## 2013-05-18 NOTE — Progress Notes (Signed)
Patient ID: Kaitlin Wong, female   DOB: 1980/12/11, 32 y.o.   MRN: [id number] Subjective: Patient reports comatose  Objective: Vital signs in last 24 hours: Temp:  [100.3 F (37.9 C)-103.2 F (39.6 C)] 103.2 F (39.6 C) (07/10 0855) Pulse Rate:  [78-186] 186 (07/10 0855) Resp:  [20-35] 21 (07/10 0855) SpO2:  [81 %-100 %] 81 % (07/10 0855)  Intake/Output from previous day: 07/09 0701 - 07/10 0700 In: 854.6 [I.V.:774.6; NG/GT:80] Out: 1300 [Urine:1300] Intake/Output this shift:    now extubated. has labored breathing. No response to stim.  Lab Results:  Recent Labs  05/16/13 0500  WBC 22.7*  HGB 7.1*  HCT 21.4*  PLT 663*   BMET  Recent Labs  05/16/13 0500  NA 143  K 4.1  CL 109  CO2 23  GLUCOSE 118*  BUN 19  CREATININE 0.43*  CALCIUM 9.4    Studies/Results: No results found.  Assessment/Plan: Family has decided to withdraw supportive care. She has been devastated since her arrival at the hospital, and withdrawal of care is a very reasonable decision.   LOS: 13 days  as above   Reinaldo Meeker, MD 05/14/2013, 1:58 PM

## 2013-05-18 NOTE — Progress Notes (Signed)
UR completed 

## 2013-05-19 ENCOUNTER — Encounter: Payer: Self-pay | Admitting: Cardiology

## 2013-05-19 NOTE — Progress Notes (Signed)
Noted that patients glasses where left at the bedside.  I have called her mother mother and informed her.  She reports that she will pick pt's glasses up on 05-19-2013.  Glasses placed in a belonging bag and at nurses station to give to pt's mother Tammy Yo.
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
## 2013-06-09 DEATH — deceased

## 2013-07-10 DEATH — deceased

## 2013-12-03 IMAGING — DX DG CHEST 1V PORT
1 series · 1 of 1 positions shown · non-contrast
Comparison: 05/06/2013.

CLINICAL DATA: Intubation, trauma.

PORTABLE CHEST - 1 VIEW

[portable]
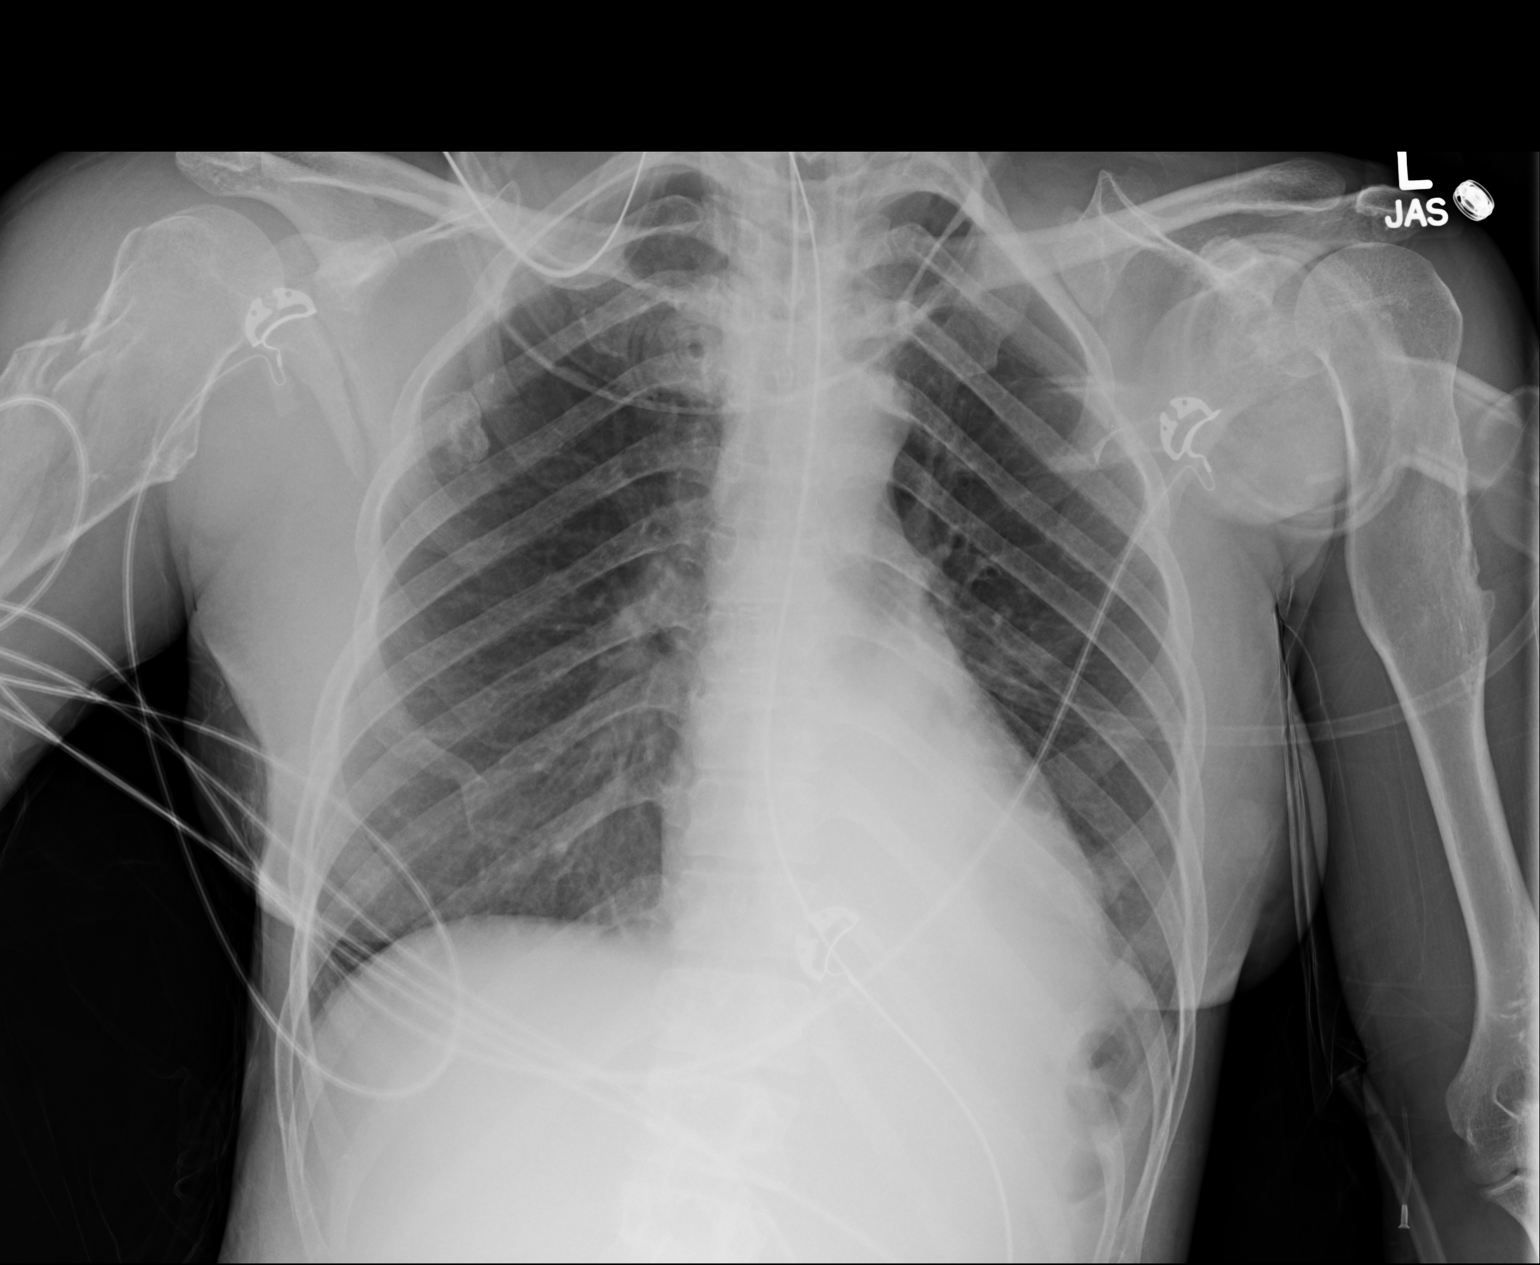

[1 of 1 positions shown; findings below may reference images not displayed]

FINDINGS: The tip of the endotracheal tube is remain 5.5 cm above
the carina.  Nasogastric extends well into the stomach.  The tip of
this tube is not on this film.  There continues to be diffuse
retrocardiac opacity consistent with left lower lobe volume loss.
This is stable.  .  The lungs otherwise remain clear.  No
consolidation or edema.  No effusions or pneumothoraces.  Fractures
of proximal humerus and of the right scapula are again identified.
IMPRESSION: There continues to be left lower lobe volume loss/collapse.  No
other acute interval changes.  Stable support apparatus.

## 2013-12-04 IMAGING — CR DG CHEST 1V PORT
1 series · 1 of 1 positions shown · non-contrast
Comparison: Portable chest x-ray of [DATE] and 05/06/2013

CLINICAL DATA: Respiratory failure

PORTABLE CHEST - 1 VIEW

[AP]
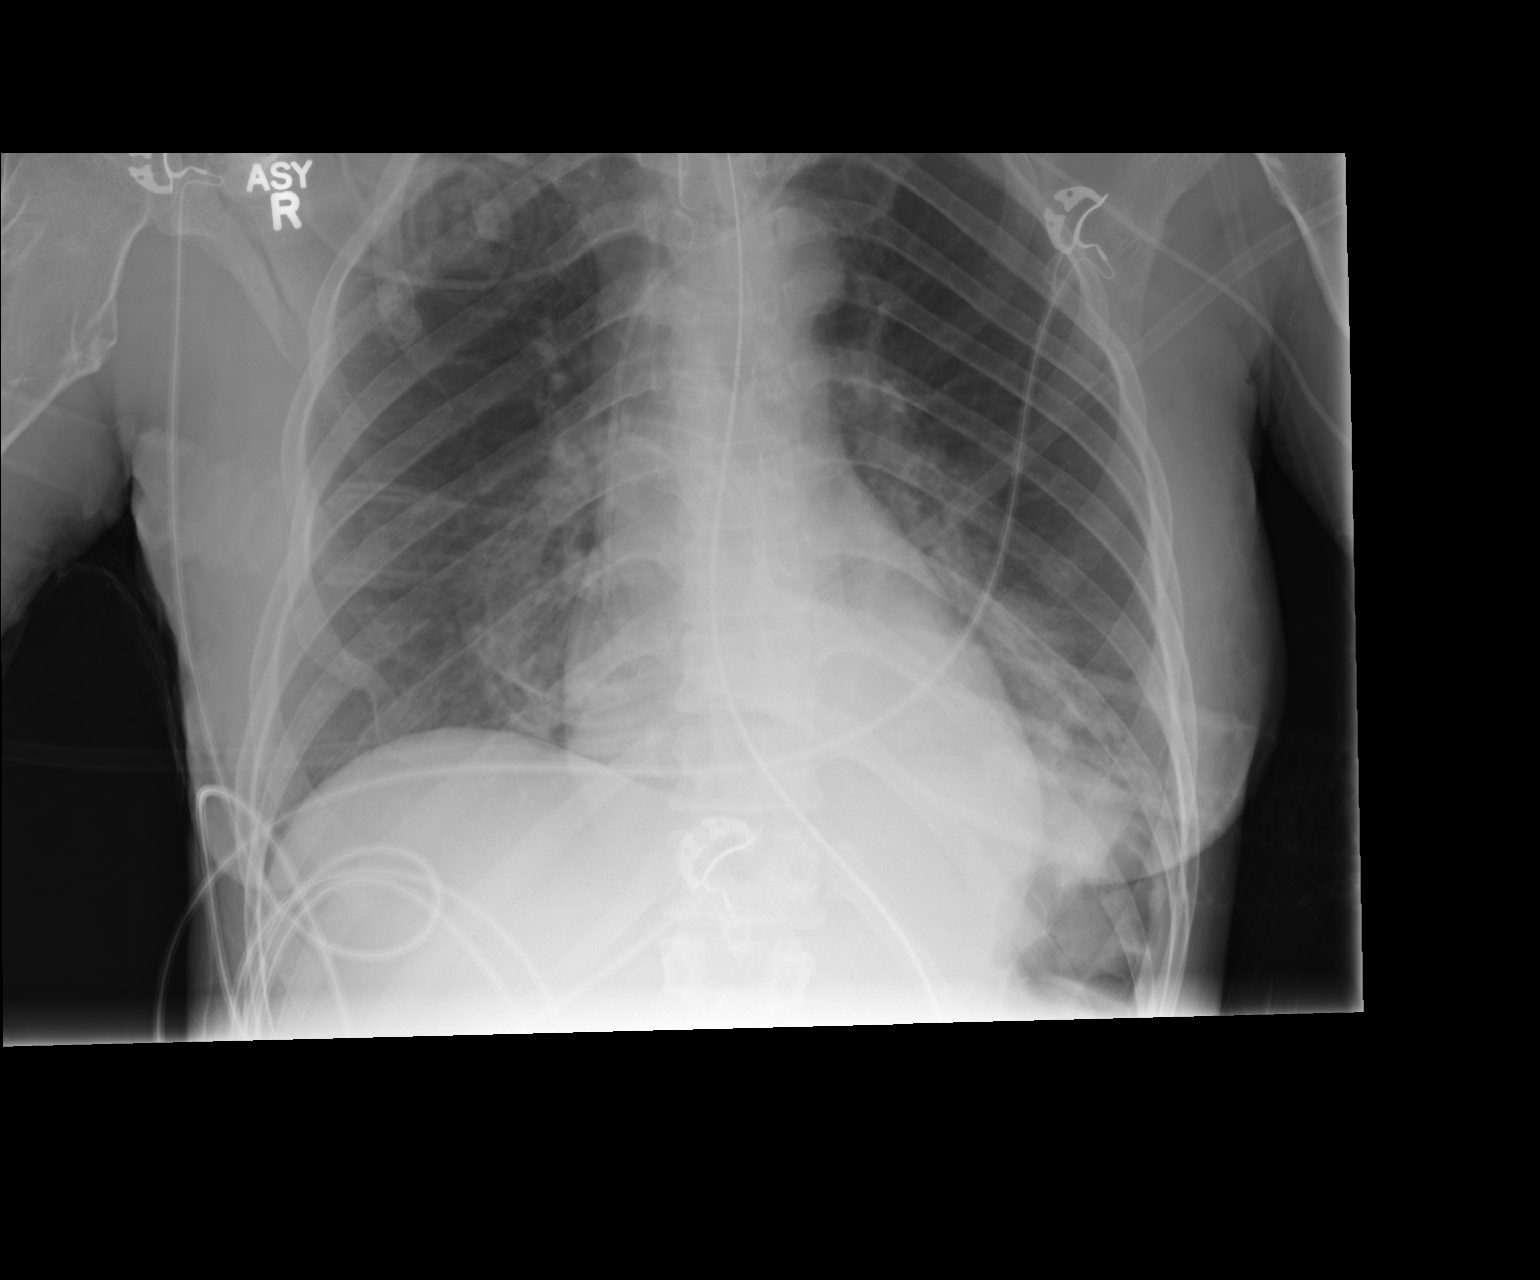

[1 of 1 positions shown; findings below may reference images not displayed]

FINDINGS: There is more opacity at the left lung base consistent
with previously demonstrated effusion and probable atelectasis or
pneumonia.  Mild haziness at the right lung base may reflect a
small effusion noted on CT.  Endotracheal is unchanged in position
with the tip approximately 3.2 cm above the carina.  Left central
venous line is unchanged in position overlying the expected SVC -
RA junction.}
IMPRESSION: 1.  Increase in left basilar opacity consistent with atelectasis,
left effusion, and possibly developing pneumonia.

2.  Endotracheal tip approximally 3.2 cm above the carina.]
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
